# Patient Record
Sex: Female | Born: 1990 | Race: Black or African American | Hispanic: No | Marital: Single | Smoking: Current every day smoker
Health system: Southern US, Community
[De-identification: ages and names within clinical notes are randomized; demographics above are authoritative.]

## PROBLEM LIST (undated history)

## (undated) DIAGNOSIS — R51 Headache: Secondary | ICD-10-CM

---
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
## 2008-01-01 ENCOUNTER — Encounter (INDEPENDENT_AMBULATORY_CARE_PROVIDER_SITE_OTHER): Payer: Self-pay | Admitting: Family Medicine

## 2008-01-01 ENCOUNTER — Ambulatory Visit (HOSPITAL_COMMUNITY): Admission: RE | Admit: 2008-01-01 | Discharge: 2008-01-01 | Payer: Self-pay | Admitting: Family Medicine

## 2008-01-25 ENCOUNTER — Encounter: Payer: Self-pay | Admitting: *Deleted

## 2008-01-31 ENCOUNTER — Ambulatory Visit: Payer: Self-pay | Admitting: Obstetrics and Gynecology

## 2008-01-31 ENCOUNTER — Inpatient Hospital Stay (HOSPITAL_COMMUNITY): Admission: AD | Admit: 2008-01-31 | Discharge: 2008-01-31 | Payer: Self-pay | Admitting: Obstetrics & Gynecology

## 2008-02-06 ENCOUNTER — Inpatient Hospital Stay (HOSPITAL_COMMUNITY): Admission: AD | Admit: 2008-02-06 | Discharge: 2008-02-06 | Payer: Self-pay | Admitting: Obstetrics & Gynecology

## 2008-02-06 ENCOUNTER — Ambulatory Visit: Payer: Self-pay | Admitting: Advanced Practice Midwife

## 2008-02-15 ENCOUNTER — Encounter (INDEPENDENT_AMBULATORY_CARE_PROVIDER_SITE_OTHER): Payer: Self-pay | Admitting: *Deleted

## 2008-02-19 ENCOUNTER — Ambulatory Visit: Payer: Self-pay | Admitting: Family Medicine

## 2008-02-19 ENCOUNTER — Encounter (INDEPENDENT_AMBULATORY_CARE_PROVIDER_SITE_OTHER): Payer: Self-pay | Admitting: Family Medicine

## 2008-02-19 DIAGNOSIS — A630 Anogenital (venereal) warts: Secondary | ICD-10-CM

## 2008-02-19 LAB — CONVERTED CEMR LAB
HCT: 35 % — ABNORMAL LOW (ref 36.0–46.0)
MCV: 96.7 fL (ref 78.0–100.0)
Platelets: 273 10*3/uL (ref 150–400)
RDW: 12.9 % (ref 11.5–15.5)

## 2008-03-14 ENCOUNTER — Ambulatory Visit: Payer: Self-pay | Admitting: Family Medicine

## 2008-03-26 ENCOUNTER — Ambulatory Visit: Payer: Self-pay | Admitting: Family Medicine

## 2008-03-29 HISTORY — PX: HERNIA REPAIR: SHX51

## 2008-04-10 ENCOUNTER — Ambulatory Visit: Payer: Self-pay | Admitting: Family Medicine

## 2008-04-12 ENCOUNTER — Ambulatory Visit: Payer: Self-pay | Admitting: Obstetrics and Gynecology

## 2008-04-12 ENCOUNTER — Inpatient Hospital Stay (HOSPITAL_COMMUNITY): Admission: AD | Admit: 2008-04-12 | Discharge: 2008-04-12 | Payer: Self-pay | Admitting: Obstetrics & Gynecology

## 2008-04-12 ENCOUNTER — Telehealth (INDEPENDENT_AMBULATORY_CARE_PROVIDER_SITE_OTHER): Payer: Self-pay | Admitting: Family Medicine

## 2008-04-23 ENCOUNTER — Ambulatory Visit: Payer: Self-pay | Admitting: Family Medicine

## 2008-05-03 ENCOUNTER — Ambulatory Visit: Payer: Self-pay | Admitting: Family Medicine

## 2008-05-03 ENCOUNTER — Encounter (INDEPENDENT_AMBULATORY_CARE_PROVIDER_SITE_OTHER): Payer: Self-pay | Admitting: Family Medicine

## 2008-05-03 ENCOUNTER — Encounter: Payer: Self-pay | Admitting: *Deleted

## 2008-05-06 ENCOUNTER — Encounter: Payer: Self-pay | Admitting: *Deleted

## 2008-05-07 ENCOUNTER — Encounter: Payer: Self-pay | Admitting: *Deleted

## 2008-05-08 ENCOUNTER — Ambulatory Visit (HOSPITAL_COMMUNITY): Admission: RE | Admit: 2008-05-08 | Discharge: 2008-05-08 | Payer: Self-pay | Admitting: Family Medicine

## 2008-05-08 ENCOUNTER — Inpatient Hospital Stay (HOSPITAL_COMMUNITY): Admission: AD | Admit: 2008-05-08 | Discharge: 2008-05-09 | Payer: Self-pay | Admitting: Obstetrics & Gynecology

## 2008-05-09 ENCOUNTER — Ambulatory Visit: Payer: Self-pay | Admitting: Family Medicine

## 2008-05-14 ENCOUNTER — Inpatient Hospital Stay (HOSPITAL_COMMUNITY): Admission: AD | Admit: 2008-05-14 | Discharge: 2008-05-14 | Payer: Self-pay | Admitting: Family Medicine

## 2008-05-14 ENCOUNTER — Telehealth (INDEPENDENT_AMBULATORY_CARE_PROVIDER_SITE_OTHER): Payer: Self-pay | Admitting: Family Medicine

## 2008-05-16 ENCOUNTER — Ambulatory Visit: Payer: Self-pay | Admitting: Family Medicine

## 2008-05-17 ENCOUNTER — Ambulatory Visit: Payer: Self-pay | Admitting: Family Medicine

## 2008-05-21 ENCOUNTER — Inpatient Hospital Stay (HOSPITAL_COMMUNITY): Admission: AD | Admit: 2008-05-21 | Discharge: 2008-05-21 | Payer: Self-pay | Admitting: Obstetrics & Gynecology

## 2008-05-22 ENCOUNTER — Ambulatory Visit: Payer: Self-pay | Admitting: Family Medicine

## 2008-05-27 ENCOUNTER — Ambulatory Visit: Payer: Self-pay | Admitting: Obstetrics and Gynecology

## 2008-05-27 ENCOUNTER — Inpatient Hospital Stay (HOSPITAL_COMMUNITY): Admission: AD | Admit: 2008-05-27 | Discharge: 2008-05-27 | Payer: Self-pay | Admitting: Obstetrics & Gynecology

## 2008-05-27 ENCOUNTER — Inpatient Hospital Stay (HOSPITAL_COMMUNITY): Admission: AD | Admit: 2008-05-27 | Discharge: 2008-05-29 | Payer: Self-pay | Admitting: Obstetrics & Gynecology

## 2008-05-27 ENCOUNTER — Encounter (INDEPENDENT_AMBULATORY_CARE_PROVIDER_SITE_OTHER): Payer: Self-pay | Admitting: Family Medicine

## 2008-05-27 ENCOUNTER — Ambulatory Visit: Payer: Self-pay | Admitting: Family Medicine

## 2008-05-30 ENCOUNTER — Telehealth (INDEPENDENT_AMBULATORY_CARE_PROVIDER_SITE_OTHER): Payer: Self-pay | Admitting: Family Medicine

## 2008-07-16 ENCOUNTER — Emergency Department (HOSPITAL_COMMUNITY): Admission: EM | Admit: 2008-07-16 | Discharge: 2008-07-16 | Payer: Self-pay | Admitting: Emergency Medicine

## 2008-07-17 ENCOUNTER — Telehealth: Payer: Self-pay | Admitting: *Deleted

## 2008-07-19 ENCOUNTER — Ambulatory Visit: Payer: Self-pay | Admitting: Family Medicine

## 2008-07-19 DIAGNOSIS — K429 Umbilical hernia without obstruction or gangrene: Secondary | ICD-10-CM | POA: Insufficient documentation

## 2008-10-24 ENCOUNTER — Encounter: Payer: Self-pay | Admitting: Family Medicine

## 2008-11-26 ENCOUNTER — Encounter: Payer: Self-pay | Admitting: Family Medicine

## 2008-11-29 ENCOUNTER — Encounter: Payer: Self-pay | Admitting: Family Medicine

## 2009-03-25 ENCOUNTER — Encounter: Payer: Self-pay | Admitting: Family Medicine

## 2009-03-25 ENCOUNTER — Ambulatory Visit: Payer: Self-pay | Admitting: Family Medicine

## 2009-03-25 DIAGNOSIS — R110 Nausea: Secondary | ICD-10-CM

## 2009-03-25 DIAGNOSIS — N912 Amenorrhea, unspecified: Secondary | ICD-10-CM | POA: Insufficient documentation

## 2009-03-25 LAB — CONVERTED CEMR LAB
Antibody Screen: NEGATIVE
Basophils Relative: 0 % (ref 0–1)
Eosinophils Absolute: 0.1 10*3/uL (ref 0.0–0.7)
Eosinophils Relative: 1 % (ref 0–5)
HCT: 37.4 % (ref 36.0–46.0)
Hemoglobin: 12.8 g/dL (ref 12.0–15.0)
Hepatitis B Surface Ag: NEGATIVE
MCHC: 34.2 g/dL (ref 30.0–36.0)
MCV: 92.6 fL (ref 78.0–100.0)
Monocytes Absolute: 0.5 10*3/uL (ref 0.1–1.0)
Monocytes Relative: 6 % (ref 3–12)
RBC: 4.04 M/uL (ref 3.87–5.11)
RDW: 12.7 % (ref 11.5–15.5)
Rh Type: POSITIVE
Sickle Cell Screen: NEGATIVE

## 2009-04-14 ENCOUNTER — Ambulatory Visit: Payer: Self-pay | Admitting: Family Medicine

## 2009-04-17 ENCOUNTER — Encounter: Payer: Self-pay | Admitting: Family Medicine

## 2009-04-17 ENCOUNTER — Encounter (INDEPENDENT_AMBULATORY_CARE_PROVIDER_SITE_OTHER): Payer: Self-pay | Admitting: *Deleted

## 2009-04-17 ENCOUNTER — Ambulatory Visit (HOSPITAL_COMMUNITY): Admission: RE | Admit: 2009-04-17 | Discharge: 2009-04-17 | Payer: Self-pay | Admitting: Family Medicine

## 2009-05-12 ENCOUNTER — Encounter: Payer: Self-pay | Admitting: Family Medicine

## 2009-05-12 ENCOUNTER — Other Ambulatory Visit: Admission: RE | Admit: 2009-05-12 | Discharge: 2009-05-12 | Payer: Self-pay | Admitting: Family Medicine

## 2009-05-12 ENCOUNTER — Ambulatory Visit: Payer: Self-pay | Admitting: Family Medicine

## 2009-05-12 LAB — CONVERTED CEMR LAB
Chlamydia, DNA Probe: NEGATIVE
GC Probe Amp, Genital: NEGATIVE
Whiff Test: NEGATIVE

## 2009-05-14 ENCOUNTER — Encounter: Payer: Self-pay | Admitting: Family Medicine

## 2009-05-19 ENCOUNTER — Encounter: Payer: Self-pay | Admitting: Family Medicine

## 2009-05-21 ENCOUNTER — Encounter: Payer: Self-pay | Admitting: *Deleted

## 2009-05-22 ENCOUNTER — Telehealth: Payer: Self-pay | Admitting: *Deleted

## 2009-05-23 ENCOUNTER — Encounter: Payer: Self-pay | Admitting: *Deleted

## 2009-05-28 ENCOUNTER — Encounter: Payer: Self-pay | Admitting: *Deleted

## 2009-06-04 ENCOUNTER — Ambulatory Visit (HOSPITAL_COMMUNITY): Admission: RE | Admit: 2009-06-04 | Discharge: 2009-06-04 | Payer: Self-pay | Admitting: Family Medicine

## 2009-06-04 ENCOUNTER — Encounter: Payer: Self-pay | Admitting: Family Medicine

## 2009-06-11 ENCOUNTER — Encounter: Payer: Self-pay | Admitting: Family Medicine

## 2009-07-08 ENCOUNTER — Ambulatory Visit: Payer: Self-pay | Admitting: Family Medicine

## 2009-08-21 ENCOUNTER — Ambulatory Visit: Payer: Self-pay | Admitting: Family Medicine

## 2009-09-03 ENCOUNTER — Encounter: Payer: Self-pay | Admitting: Family Medicine

## 2009-09-03 ENCOUNTER — Ambulatory Visit: Payer: Self-pay | Admitting: Family Medicine

## 2009-09-03 LAB — CONVERTED CEMR LAB
HCT: 34.2 % — ABNORMAL LOW
Hemoglobin: 11.9 g/dL — ABNORMAL LOW
MCHC: 34.8 g/dL
MCV: 95.8 fL
Platelets: 225 K/uL
RBC: 3.57 M/uL — ABNORMAL LOW
RDW: 12.9 %
WBC: 10.2 10*3/microliter

## 2009-09-04 ENCOUNTER — Encounter: Payer: Self-pay | Admitting: Family Medicine

## 2009-09-04 ENCOUNTER — Ambulatory Visit (HOSPITAL_COMMUNITY): Admission: RE | Admit: 2009-09-04 | Discharge: 2009-09-04 | Payer: Self-pay | Admitting: Family Medicine

## 2009-09-17 ENCOUNTER — Ambulatory Visit: Payer: Self-pay | Admitting: Family Medicine

## 2009-09-25 ENCOUNTER — Encounter: Payer: Self-pay | Admitting: Family Medicine

## 2009-09-26 ENCOUNTER — Encounter: Payer: Self-pay | Admitting: Family Medicine

## 2009-09-26 ENCOUNTER — Ambulatory Visit: Payer: Self-pay | Admitting: Family Medicine

## 2009-09-26 DIAGNOSIS — B373 Candidiasis of vulva and vagina: Secondary | ICD-10-CM

## 2009-09-26 LAB — CONVERTED CEMR LAB
Chlamydia, DNA Probe: NEGATIVE
GC Probe Amp, Genital: NEGATIVE

## 2009-09-30 ENCOUNTER — Ambulatory Visit: Payer: Self-pay | Admitting: Family Medicine

## 2009-09-30 ENCOUNTER — Encounter: Payer: Self-pay | Admitting: Family Medicine

## 2009-10-09 ENCOUNTER — Ambulatory Visit: Payer: Self-pay | Admitting: Family Medicine

## 2009-10-16 ENCOUNTER — Inpatient Hospital Stay (HOSPITAL_COMMUNITY): Admission: AD | Admit: 2009-10-16 | Discharge: 2009-10-18 | Payer: Self-pay | Admitting: Obstetrics & Gynecology

## 2009-10-17 ENCOUNTER — Encounter: Payer: Self-pay | Admitting: Family Medicine

## 2009-11-26 ENCOUNTER — Ambulatory Visit: Payer: Self-pay | Admitting: Family Medicine

## 2009-12-22 ENCOUNTER — Emergency Department (HOSPITAL_COMMUNITY): Admission: EM | Admit: 2009-12-22 | Discharge: 2009-12-22 | Payer: Self-pay | Admitting: Emergency Medicine

## 2010-01-07 ENCOUNTER — Ambulatory Visit: Payer: Self-pay | Admitting: Family Medicine

## 2010-04-01 ENCOUNTER — Ambulatory Visit: Admit: 2010-04-01 | Payer: Self-pay

## 2010-04-19 ENCOUNTER — Encounter: Payer: Self-pay | Admitting: Family Medicine

## 2010-04-28 NOTE — Miscellaneous (Signed)
Summary: OB US approved  Clinical Lists Changes medsolutions approved ob ultrasound #[id number].Golden Circle RN  May 23, 2009 4:06 PM

## 2010-04-28 NOTE — Miscellaneous (Signed)
Summary: OB US approved  Clinical Lists Changes medsolutions approved the ob ultrasound #[id number].Golden Circle RN  May 28, 2009 10:00 AM

## 2010-04-28 NOTE — Assessment & Plan Note (Signed)
Summary: 32.0 wk O, G4P1021,1 GTT, labs.    Vital Signs:  Patient profile:   20 year old female Weight:      137.1 pounds Temp:     98.9 degrees F oral Pulse rate:   90 / minute Pulse rhythm:   regular BP sitting:   99 / 63  (left arm) Cuff size:   regular  Vitals Entered By: Loralee Pacas CMA (September 03, 2009 2:59 PM)  Primary Care Provider:  Jamie Brookes MD  CC:  32.0 wks OB.  History of Present Illness: 20 y/o [id number] with EDD of 10-29-09. She comes in today having gained weight but not gained fundal height. She is getting her 1 hr GTT and other labs done today since she could not stay for them last time.    As per Corrie Dandy Martin's note:  She reports she has had 2 prior SAB's, both < 10 weeks.  She has no idea who her prior OB was; she only knows [facility] so records regarding these miscarriages are not obtainable.  She has a family h/o blood clots (father and paternal grandmother both died from blood clots- father was in his 72's).  She had bloodwork on 09/13/07 that included lupus anticoagulant, anticardiolipin antibody, and beta-2 glycoprotein IgG and IgM that were all negative.   Habits & Providers  Alcohol-Tobacco-Diet     Cigarette Packs/Day: n/a  Current Medications (verified): 1)  Prenatal Vitamins 0.8 Mg Tabs (Prenatal Multivit-Min-Fe-Fa) .... Take 1 Daily  Allergies (verified): 1)  ! Codeine 2)  ! Ultram (Tramadol Hcl)  Social History: Not working, dropped out of high school.  Lives with mom and step dad, boyfriend lives with her.  Sexually active at age 47 w/high risk behavior. Active smoker (4 cigs per day), but quit when she got pregnant.  Denies alcohol and illicit drug use.  Mom is deaf.  Review of Systems        vitals reviewed and pertinent negatives and positives seen in HPI    Impression & Recommendations:  Problem # 1:  PREGNANT STATE, INCIDENTAL (ICD-V22.2) Assessment Deteriorated  Pt has a fundal height of 28 and she is 32  weeks. She has been measuring 28 cm for the last 2 visits.  Plan to get an [name] to measure growth. Will see her back in 2 weeks unless there is somethin concerning on [name]. Passed her 1 hr GTT today.   Orders: Glucose 1 hr-[facility] (30865) Prenatal U/S > 14 weeks - 78469 (Prenatal U/S) Medicaid OB visit - [facility] ([id number])  Complete Medication List: 1)  Prenatal Vitamins 0.8 Mg Tabs (Prenatal multivit-min-fe-fa) .... Take 1 daily   Flowsheet View for Follow-up Visit    Estimated weeks of       gestation:     32.0    Weight:     137.1    Blood pressure:   99 / 63    Hx headache?     No    Nausea/vomiting?   No    Edema?     0    Bleeding?     no    Leakage/discharge?   no    Fetal activity:       yes    Labor symptoms?   no    Fundal height:      28    FHR:       155    Fetal position:      vertex    Taking  Vitamins?   Y    Smoking PPD:   n/a    Next visit:     2 wk    Resident:     Clotilde Dieter    Preceptor:     McDiarmid

## 2010-04-28 NOTE — Miscellaneous (Signed)
Summary: Anatomy US order  Clinical Lists Changes  Orders: Added new Test order of Prenatal U/S > 14 weeks - [address] (Prenatal U/S) - Signed  Appended Document: Anatomy US order Pt aware of teh early [name] need and that AFP was borderline elevated. She has no questions at this time. Encouraged to call back if she has any questions.

## 2010-04-28 NOTE — Letter (Signed)
Summary: Generic Letter  [facility]  [address]   [place], [place] [address]   Phone: [phone]  Fax: [phone]    06/11/2009  Kimberly Bradley [address] [place], [place]  [address]  Dear [name], Your pregnancy test on 03-25-09 indicated that you are pregnant. You expected due date based on ultrasound is 10-29-09. If you have any further questions please contact our office.     Sincerely,   Jamie Brookes MD

## 2010-04-28 NOTE — Miscellaneous (Signed)
Summary: OB ultra sound  Clinical Lists Changes OB ultra sound was approved Auth #[id number] .Marland KitchenGladstone Pih  April 17, 2009 4:04 PM

## 2010-04-28 NOTE — Assessment & Plan Note (Signed)
Summary: 23.6 OB   Vital Signs:  Patient profile:   20 year old female Height:      67 inches Weight:      125.6 pounds BMI:     19.74 Temp:     98.1 degrees F oral Pulse rate:   101 / minute BP sitting:   101 / 66  (left arm) Cuff size:   regular  Vitals Entered By: Gladstone Pih (July 08, 2009 2:37 PM) CC: 23.6 OB Is Patient Diabetic? No Pain Assessment Patient in pain? no        Primary Care Provider:  Jamie Brookes MD  CC:  23.6 OB.  History of Present Illness: Pt is doing well. She comes alone. Says the father of the baby is supportive. He is happy about the pregnancy. He is the father of her other child. (Destiny)  Habits & Providers  Alcohol-Tobacco-Diet     Tobacco Status: never     Cigarette Packs/Day: n/a  Allergies: 1)  ! Codeine 2)  ! Ultram (Tramadol Hcl)   Impression & Recommendations:  Problem # 1:  PREGNANT STATE, INCIDENTAL (ICD-V22.2) Assessment Unchanged PT will be due for 1 hr GTT at next visit. She is aware. She is doing well. She has no concerns. Home life is going well.  Orders: Medicaid OB visit - [facility] ([id number])  Complete Medication List: 1)  Prenatal Vitamins 0.8 Mg Tabs (Prenatal multivit-min-fe-fa) .... Take 1 daily  Patient Instructions: 1)  You will need a 1 hour glucose test at your next visit.  2)  I will see you in  1 month.     Flowsheet View for Follow-up Visit    Estimated weeks of       gestation:     23.6    Weight:     125.6    Blood pressure:   101 / 66    Headache:     No    Nausea/vomiting:   No    Edema:     0    Vaginal bleeding:   no    Vaginal discharge:   no    Fundal height:      20    FHR:       140    Fetal activity:     yes    Labor symptoms:   no    Fetal position:     N/A    Taking prenatal vits?   Y    Smoking:     n/a    Next visit:     4 wk    Resident:     Clotilde Dieter    Preceptor:     Deirdre Priest

## 2010-04-28 NOTE — Letter (Signed)
Summary: Results Follow-up Letter  [facility]  [address]   [place], [place] [address]   Phone: [phone]  Fax: [phone]    05/14/2009  [address] [place], [place]  [address]  Dear Ms. Bais,   The following are the results of your recent test(s):  Test     Result     Pap Smear    Normal_______  Not Normal__X___       Comments: Your pap smear result reads as follows: ATYPICAL SQUAMOUS CELLS OF UNDETERMINED SIGNIFICANCE  This means that we will need to do another pap smear 6 weeks after you deliver the baby. This does not cause any risk to the baby. It just needs to be reevaluated after the baby is born. There are some cells that look different so we will look at them again at your 6 week post-partum visit. If you have any questions please call my office.    Sincerely,  Jamie Brookes MD [facility]

## 2010-04-28 NOTE — Assessment & Plan Note (Signed)
Summary: 15.5 wk ob,tcb   Vital Signs:  Patient profile:   20 year old female Weight:      111.5 pounds Temp:     98 degrees F oral Pulse rate:   85 / minute Pulse rhythm:   regular BP sitting:   108 / 71  (right arm) Cuff size:   regular  Vitals Entered By: Loralee Pacas CMA (May 12, 2009 11:25 AM)   Primary Care Provider:  Jamie Brookes MD   History of Present Illness: 20 y/o [phone] with EDD of 10-29-09. She comes in today with Isiah the father of her baby. She did not get a PAP/GC/Chlam at her last appt so we are doing them today.   Habits & Providers  Alcohol-Tobacco-Diet     Cigarette Packs/Day: n/a  Current Medications (verified): 1)  Prenatal Vitamins 0.8 Mg Tabs (Prenatal Multivit-Min-Fe-Fa) .... Take 1 Daily  Allergies (verified): 1)  ! Codeine 2)  ! Ultram (Tramadol Hcl)  Review of Systems        vitals reviewed and pertinent negatives and positives seen in HPI   Physical Exam  Abdomen:  stria from prior pregnancy, uterus measures 9 cm. Pt is thin  Genitalia:  Normal introitus for age, no external lesions, no vaginal discharge, mucosa pink and moist, no vaginal or cervical lesions, no vaginal atrophy, no friaility or hemorrhage, normal uterus size and position, no adnexal masses or tenderness   Impression & Recommendations:  Problem # 1:  PREGNANT STATE, INCIDENTAL (ICD-V22.2) Assessment Unchanged Pt is doing well, no concerns today. She is getting the QUAD screen done today. She missed the integrative screen due to her dates change on [name]. We are doing Pap and GC/Chlam testing today since she missed it the first visit. Pt is having nausea. Suggested Vit B6 and ginger. Suggested using Tylenol for headaches. Pt has yeast found on wet prep but is assymptomatic. Will treat if pt becomes symptomatic.   Orders: AFP/Quad Scr-[facility] ([id number]) GC/Chlamydia-[facility] (87591/87491) Pap Smear-[facility] ([id number]) Wet Prep- [facility] ([address]) Medicaid OB visit - [facility]  ([id number])  Complete Medication List: 1)  Prenatal Vitamins 0.8 Mg Tabs (Prenatal multivit-min-fe-fa) .... Take 1 daily  Patient Instructions: 1)  You are 15 weeks and 5 days today. Your new Due date is 10-29-09. 2)  You can go to the pharmacy and get Vitamin B 6 to help with the nausea. Also ginger candies and other forms of ginger help with nausea.  3)  I will let you know if there is any abnormality on your lab work.  4)  I will see you in 1 month.      Flowsheet View for Follow-up Visit    Estimated weeks of       gestation:     15.5    Weight:     111.5    Blood pressure:   108 / 71    Hx headache?     few    Nausea/vomiting?   vom1/d    Edema?     0    Bleeding?     no    Leakage/discharge?   no    Fetal activity:       N/A    Labor symptoms?   no    Fundal height:      9    FHR:       155    Fetal position:      N/A    Taking Vitamins?   Y    Smoking  PPD:   n/a    Comment:     EDD changed based on [name]    Next visit:     4 wk    Resident:     Clotilde Dieter    Preceptor:     Mauricio Po  Laboratory Results  Date/Time Received: May 12, 2009 12:17 PM  Date/Time Reported: May 12, 2009 12:26 PM   Wet [place] Source: vag WBC/hpf: >20 Bacteria/hpf: 3+  Rods Clue cells/hpf: none  Negative whiff Yeast/hpf: many Trichomonas/hpf: none Comments: ...............test performed by......Marland KitchenBonnie A. [place], MLS (ASCP)cm

## 2010-04-28 NOTE — Miscellaneous (Signed)
Summary: cramping  Clinical Lists Changes 35 wk pregant. hard belly x 2 days. cramping comes & goes. baby is moving well. this is her 2nd child. she does not think she has to go to [facility] but wants to be checked. appt made with pcp at 1:30 tomorrow. told her if pain increases, baby does not move as much or gush of fluid or blood from vagina, [facility]. she agreed.Golden Circle RN  September 25, 2009 3:41 PM   Will be happy to see her. Jamie Brookes MD  September 25, 2009 10:56 PM

## 2010-04-28 NOTE — Assessment & Plan Note (Signed)
Summary: 35.6 ob   Vital Signs:  Patient profile:   20 year old female Height:      67 inches Weight:      146.1 pounds BMI:     22.97 Temp:     98.5 degrees F oral Pulse rate:   94 / minute BP sitting:   122 / 70  (left arm) Cuff size:   regular  Vitals Entered By: Gladstone Pih (September 30, 2009 2:08 PM) CC: 35.6 wk OB, GBS test Is Patient Diabetic? No Pain Assessment Patient in pain? no        Primary Care Provider:  Jamie Brookes MD  CC:  35.6 wk OB and GBS test.  History of Present Illness: G4P1021 at 35.6 wks comes in today for her regular OB check up. Pt is suppose to be seen at the next [facility] OB clinic. She has come to see me instead so I will do the GBS testing and since she just had GC/Chlam last week I will not repeat these.   Habits & Providers  Alcohol-Tobacco-Diet     Tobacco Status: never     Cigarette Packs/Day: n/a  Current Medications (verified): 1)  Prenatal Vitamins 0.8 Mg Tabs (Prenatal Multivit-Min-Fe-Fa) .... Take 1 Daily 2)  Fluconazole 150 Mg Tabs (Fluconazole) .... Take 1 Pill Now, Take Another Pill in 4 Days If You Are Not Feeling Better With Less Discharge.  Allergies (verified): 1)  ! Codeine 2)  ! Ultram (Tramadol Hcl)  Social History: Not working, dropped out of high school.  Lives with mom and step dad, boyfriend lives with her, he is not working either.  Sexually active at age 51 w/high risk behavior. Active smoker (4 cigs per day), but quit when she got pregnant.  Denies alcohol and illicit drug use.  Mom is deaf., sister is partially deafSmoking Status:  never  Review of Systems        vitals reviewed and pertinent negatives and positives seen in HPI    Impression & Recommendations:  Problem # 1:  PREGNANT STATE, INCIDENTAL (ICD-V22.2) Assessment Unchanged Pt needs to be seen at [facility] OB clinic at next appointment. She is 35.6 today. She is doing well except she still is feeling some "contractions" and has not gotten her medicine.  She was found to have a yeast infection at the last visit and has still not gotten her meds. Encouraged to go get her meds today. GBS done today  Orders: Grp B Probe-[facility] ([id number]) Medicaid OB visit - [facility] ([id number])  Complete Medication List: 1)  Prenatal Vitamins 0.8 Mg Tabs (Prenatal multivit-min-fe-fa) .... Take 1 daily 2)  Fluconazole 150 Mg Tabs (Fluconazole) .... Take 1 pill now, take another pill in 4 days if you are not feeling better with less discharge.  Patient Instructions: 1)  It was good to see you today.  2)  You need to be seen at the next [facility] OB clinic day. Please make this appointment before you leave today.  3)  Pick up your prescription today and start the medicine today. You will continue to have uterine irritability until you treat this infection.  4)  If you have any bleeding, gush of fluid, strong contractions that are coming every 4 minutes apart or any other major concern go to [facility]. 5)  If you are being admitted tot he hospital for labor have the nurse page met at [phone].  6)  You need to come every week from now until the baby is  born.     Marketing executive for Follow-up Visit    Estimated weeks of       gestation:     35.6    Weight:     146.1    Blood pressure:   122 / 70    Headache:     No    Nausea/vomiting:   No    Edema:     0    Vaginal bleeding:   no    Vaginal discharge:   d/c    Fundal height:      33    FHR:       143    Fetal activity:     yes    Labor symptoms:   few ctx    Fetal position:     vertex    Taking prenatal vits?   Y    Smoking:     n/a    Next visit:     1 wk    Resident:     Braian Tijerina    Preceptor:     Deirdre Priest

## 2010-04-28 NOTE — Assessment & Plan Note (Signed)
Summary: NOB ,  [id number]   Vital Signs:  Patient profile:   20 year old female LMP:     01/08/2009 Weight:      112.6 pounds Temp:     98.1 degrees F oral Pulse rate:   60 / minute Pulse rhythm:   regular BP sitting:   104 / 66  (left arm) Cuff size:   regular  Vitals Entered By: Loralee Pacas CMA (April 14, 2009 4:51 PM) CC: New ob LMP (date): 01/08/2009 EDC by LMP==> 10/15/2009 LMP - Character: light LMP - Reliable? approximate (month known) Menarche (age onset years): 11   Menses interval (days): 30 Menstrual flow (days): 4-7 On BCP's at conception: no Date of + home preg. test: 03/24/2009 Enter LMP: 01/08/2009 Last PAP Result LGSIL   Primary Care Provider:  Jamie Brookes MD  CC:  New ob.  History of Present Illness: New OB: G4P1020 comes in today for a few OB visit. She has not had a period since around Oct 13th (she does not remember when exactly). She started having symptoms of pregnancy in Dec. She took a pregancy test at the end of Dec. She is not taking prenatal vitamins because she has not gotten her Medicaid card yet.   Habits & Providers  Alcohol-Tobacco-Diet     Cigarette Packs/Day: n/a  Allergies: 1)  ! Codeine 2)  ! Ultram (Tramadol Hcl)  Social History: Education:  highschool  Hepatitis Risk:  no   Impression & Recommendations:  Problem # 1:  PREGNANT STATE, INCIDENTAL (ICD-V22.2) Assessment New Pt is very unsure of her dates. Plan to get [name]. Since it is very late today we are going to postpone her Pap smear and GC/Chlam until the next visit in 2-3 weeks.  We will also discuss the integrated screen vs QUAD screen at that time.   Orders: Prenatal U/S < 14 weeks - [address]  (Prenatal U/S) Other OB visit- [facility] ([facility])  Complete Medication List: 1)  Darvocet-n 50 50-325 Mg Tabs (Propoxyphene n-apap) .Marland Kitchen.. 1-2 tablets by mouth every 6 hrs as needed for stomach pain 2)  Prenatal Vitamins 0.8 Mg Tabs (Prenatal multivit-min-fe-fa) .... Take 1  daily  Patient Instructions: 1)  We will set up an ultrasound for you and either we will call you or the ultrasound place will call you with an appoitnment.  2)  Please make an appointment for 2-3 weeks from today to have your pap smear and GC/Chlamydia tested.  3)  Take your prenatal vitamins as soon as possible.  4)  congratulations   OB Initial Intake Information    Positive HCG by: self    Race: White    Marital status: Single    Occupation: homemaker    Education (last grade completed): highschool     Number of children at home: 1    [facility]ospital [facility] delivery: [facility]    Newborn's physician: Joice Lofts Sanskriti Greenlaw  FOB Information    Husband/Father of baby: Roslyn Smiling    FOB occupation Unemployed    Phone: [phone]    FOB Comments: Involved with pregnancy, lives with Ms. Kercheval.  Menstrual History    LMP (date): 01/08/2009    EDC by LMP: 10/15/2009    LMP - Character: light    LMP - Reliable? : approximate (month known)    Menarche: 11 years    Menses interval: 30 days    Menstrual flow 4-7 days    On BCP's at conception: no    Date of positive (+)  home preg. test: 03/24/2009    Pre Pregnancy Weight: 112 lbs.    Symptoms since LMP: amenorrhea, nausea, vomiting, fatigue, irritability, tender breasts, urinary frequency  Prenatal Visit EDC Confirmation:    LMP reliable? approximate (month known)    Last menses onset (LMP) date: 01/08/2009    EDC by LMP: 10/15/2009   Past Pregnancy History    Gravida:     4    Term Births:     1    Para:       1  Pregnancy # 3    Delivery date:     05/28/2008    Weeks Gestation:   40    Preterm labor:     no    Delivery type:     NSVD    Hours of labor:     12    Anesthesia type:     epidural    [facility]    Infant Sex:     Female    Birth weight:     8 lbs 5 oz    Name:     Egbert Garibaldi  Pregnancy # 4    Comments:     current   Genetic History     Child with other       birth  defect:             comments: Dad's brother had child with 6th finger on each hand.   Infection Risk History    High Risk Hepatitis B: no    Immunized against Hepatitis B: no    Exposure to TB: yes    Patient with history of Genital Herpes: no    Sexual partner with history of Genital Herpes: no    History of STD (GC, Chlamydia, Syphilis, HPV): no    Rash, Viral, or Febrile Illness since LMP: no    Exposure to Cat Litter: no    Chicken Pox Immune Status: Hx of Disease: Immune    History of Parvovirus (Fifth Disease): no    Occupational Exposure to Children: none  Environmental Exposures    Xray Exposure since LMP: no    Chemical or other exposure: no    Medication, drug, or alcohol use since LMP: no   Flowsheet View for Follow-up Visit    Estimated weeks of       gestation:     13.5    Weight:     112.6    Blood pressure:   104 / 66    Headache:     No    Nausea/vomiting:   vom1/d    Edema:     0    Vaginal bleeding:   no    Vaginal discharge:   no    Fetal activity:     N/A    Labor symptoms:   no    Fetal position:     N/A    Taking prenatal vits?   Y    Smoking:     n/a    Next visit:     4 wk  Appended Document: NOB/DSL    Phone Note Outgoing Call   Call placed by: Loralee Pacas CMA,  April 15, 2009 4:47 PM Summary of Call: called pt several times to inform her of the [name] appt that has been set up and the pt does not have VM set up on her phone.  will try again tomorrow  Follow-up for Phone Call  called pt to inform her of her [name] appt and could not leave a VM Follow-up by: Loralee Pacas CMA,  April 16, 2009 1:44 PM      Appended Document: NOB/DSL Pt due for flu shot.  Rubella non-immune.  PE deferred due to time constraints.  Will complete next visit.

## 2010-04-28 NOTE — Assessment & Plan Note (Signed)
Summary: 35.2 wk ob check feeling contractions   Vital Signs:  Patient profile:   20 year old female Weight:      144.6 pounds Temp:     98.6 degrees F oral Pulse rate:   73 / minute Pulse rhythm:   regular BP sitting:   107 / 70  (left arm) Cuff size:   regular  Vitals Entered By: Loralee Pacas CMA (September 26, 2009 1:39 PM) CC: 4 days of contractions Comments cramping and pain in bottom of stomach since tuesday, some d/c   Primary Care Provider:  Jamie Brookes MD  CC:  4 days of contractions.  History of Present Illness: PT comes in at 35.2 weeks and has been having ''contractions" that are occuring "all the time" and only let up for about 10 minutes at the most. She has been feeling these for 4 days now. No gush of fluid, no blood, but increased discharge.    Habits & Providers  Alcohol-Tobacco-Diet     Cigarette Packs/Day: n/a  Current Medications (verified): 1)  Prenatal Vitamins 0.8 Mg Tabs (Prenatal Multivit-Min-Fe-Fa) .... Take 1 Daily 2)  Fluconazole 150 Mg Tabs (Fluconazole) .... Take 1 Pill Now, Take Another Pill in 4 Days If You Are Not Feeling Better With Less Discharge.  Allergies: 1)  ! Codeine 2)  ! Ultram (Tramadol Hcl)  Review of Systems        vitals reviewed and pertinent negatives and positives seen in HPI   Physical Exam  General:  Well-developed,well-nourished,in no acute distress; alert,appropriate and cooperative throughout examination Abdomen:  no contractions felt while I examined belly.  Genitalia:  copious amounts of curdish white discharge in vaginal vault. Nitrozine neg upon testing.  Psych:  Cognition and judgment appear intact. Alert and cooperative with normal attention span and concentration. No apparent delusions, illusions, hallucinations   Impression & Recommendations:  Problem # 1:  CANDIDIASIS OF VULVA AND VAGINA (ICD-112.1) Assessment New pt found to have a copious amount of discharge and found to be yeast positive. Will  treat with Diflucan. She will be seen again next week. She  is likely having uterine irritability due to this infection. Pt given [facility]. Go for gush of fluids, if her contractions don't calm down the the medicine, if she has any bleeding, etc.... Pt agrees.   Her updated medication list for this problem includes:    Fluconazole 150 Mg Tabs (Fluconazole) .Marland Kitchen... Take 1 pill now, take another pill in 4 days if you are not feeling better with less discharge.  Orders: [facility]- Est Level  3 (44010)  Problem # 2:  PREGNANT STATE, INCIDENTAL (ICD-V22.2) Assessment: Unchanged Pt is 35.2 today. She is to be seen in [facility] next week. She will need GBS testing at next appointment. She was fern neg and nitrozine neg.   Orders: GC/Chlamydia-[facility] ([id number]) Miscellaneous Lab Charge-[facility] [phone]) Wet Prep- [facility] (66440) [facility]- Est Level  3 (34742)  Complete Medication List: 1)  Prenatal Vitamins 0.8 Mg Tabs (Prenatal multivit-min-fe-fa) .... Take 1 daily 2)  Fluconazole 150 Mg Tabs (Fluconazole) .... Take 1 pill now, take another pill in 4 days if you are not feeling better with less discharge.  Patient Instructions: 1)  discussed that she has an appointment on Tues July 5th ith me.  2)  I ill see her then. 3)  If she has any labor symptoms (gush of fluid, blood, worse cramping) [facility].  4)  told verbally to patient.  Prescriptions: FLUCONAZOLE 150 MG TABS (FLUCONAZOLE) take 1 pill now, take another pill in 4 days if you are not feeling better with less discharge.  #2 x 0   Entered and Authorized by:   Jamie Brookes MD   Signed by:   Jamie Brookes MD on 09/26/2009   Method used:   Electronically to        [facility]  [address] [phone]* (retail)       [address]       [place], [place]  [address]       Ph: [phone] or [phone]       Fax: [phone]   RxID:   [id number]     Flowsheet  View for Follow-up Visit    Weight:     144.6    Blood pressure:   107 / 70    Nausea/vomiting:   No    Edema:     0    Vaginal bleeding:   no    Vaginal discharge:   d/c    Fundal height:      32    FHR:       140    Fetal activity:     yes    Labor symptoms:   few ctx    Taking prenatal vits?   Y    Smoking:     n/a    Laboratory Results  Date/Time Received: September 26, 2009 2:14 PM  Date/Time Reported: September 26, 2009 2:32 PM   [employer] Source: vag WBC/hpf: LOADED Bacteria/hpf: 3+  Rods Clue cells/hpf: none  Negative whiff Yeast/hpf: LOADED Trichomonas/hpf: none  Other Tests  [place]: absent Comments: ...............test performed by......Marland KitchenBonnie A. [place], MLS (ASCP)cm

## 2010-04-28 NOTE — Progress Notes (Signed)
Summary: [name] appt  Phone Note Call from Patient Call back at Home Phone [phone]   Caller: Patient Summary of Call: pt wants to know when her [name] appt is. Initial call taken by: De Nurse,  May 22, 2009 4:17 PM  Follow-up for Phone Call        spoke with pt. she has appt with MFM 06/04/09@2pm  Follow-up by: Loralee Pacas CMA,  May 22, 2009 4:22 PM     Appended Document: [name] appt    Clinical Lists Changes  Orders: Added new Test order of Radiology other (Radiology Other) - Signed

## 2010-04-28 NOTE — Assessment & Plan Note (Signed)
Summary: 34.9 ob, G4P1021, EDD 10-29-09, needs GBS at next visit   Vital Signs:  Patient profile:   20 year old female Weight:      141 pounds Temp:     98.6 degrees F oral Pulse rate:   86 / minute Pulse rhythm:   regular BP sitting:   114 / 73  (right arm) Cuff size:   regular  Vitals Entered By: Loralee Pacas CMA (September 17, 2009 9:02 AM)  Primary Care Provider:  Jamie Brookes MD  CC:  34.0 wks.  History of Present Illness: 20 y/o E4V4098 with EDD of 10-29-09. She comes in today having gained weight andt gained fundal height.  Her recent ultrasound was normal. She has a normal 1 hr GTT and labs. She will need GBS testing at next visit.  Plans to get epidural, wants Depo and bottle fed last time. Discussed Breastfeeding with patient. She may try to work with the lactation nurses this time, but says the baby wouldn't latch last time so she bottle fed.    As per Corrie Dandy Martin's note:  She reports she has had 2 prior SAB's, both < 10 weeks.  She has no idea who her prior OB was; she only knows he was somewhere in [facility] so records regarding these miscarriages are not obtainable.  She has a family h/o blood clots (father and paternal grandmother both died from blood clots- father was in his 63's).  She had bloodwork on 09/13/07 that included lupus anticoagulant, anticardiolipin antibody, and beta-2 glycoprotein IgG and IgM that were all negative.  Habits & Providers  Alcohol-Tobacco-Diet     Cigarette Packs/Day: n/a  Current Medications (verified): 1)  Prenatal Vitamins 0.8 Mg Tabs (Prenatal Multivit-Min-Fe-Fa) .... Take 1 Daily  Allergies (verified): 1)  ! Codeine 2)  ! Ultram (Tramadol Hcl)  Review of Systems        vitals reviewed and pertinent negatives and positives seen in HPI    Impression & Recommendations:  Problem # 1:  PREGNANT STATE, INCIDENTAL (ICD-V22.2) Assessment Unchanged Pt has had a little nausea and vomiting. Says it is worse with certain foods. She is  only vomiting food once a day. Plans to use Depo, bottle feed, wants epidural. Will see OB clinic in 2 weeks. Needs GBS testing at that time.   Orders: Medicaid OB visit - [facility] ([id number])  Complete Medication List: 1)  Prenatal Vitamins 0.8 Mg Tabs (Prenatal multivit-min-fe-fa) .... Take 1 daily  Patient Instructions: 1)  Make an appointment with OB clinic in 2 weeks (you will not see me but will see another doctor who works in [employer] as a one time visit). 2)  I will see you in 3 weeks.      Flowsheet View for Follow-up Visit    Estimated weeks of       gestation:     34.0    Weight:     141    Blood pressure:   114 / 73    Headache:     No    Nausea/vomiting:   vom1/d    Edema:     0    Vaginal bleeding:   no    Vaginal discharge:   no    Fundal height:      31.5    FHR:       135    Fetal activity:     yes    Labor symptoms:   no    Fetal position:  vertex    Taking prenatal vits?   Y    Smoking:     n/a    Next visit:     2 wk    Resident:     Clotilde Dieter    Preceptor:     [place]

## 2010-04-28 NOTE — Miscellaneous (Signed)
Summary: ob US approved  Clinical Lists Changes #[id number] approval for ob US.Golden Circle RN  May 21, 2009 12:21 PM  Appended Document: ob US approved    Clinical Lists Changes  Orders: Added new Test order of Ultrasound (Ultrasound) - Signed

## 2010-04-28 NOTE — Assessment & Plan Note (Signed)
Summary: depo inj,tcb  Nurse Visit   Allergies: 1)  ! Codeine 2)  ! Ultram (Tramadol Hcl)  Medication Administration  Injection # 1:    Medication: Depo-Provera 150mg     Diagnosis: CONTRACEPTIVE MANAGEMENT (ICD-V25.09)    Route: IM    Site: R deltoid    Exp Date: 05/2012    Lot #: [id number]    Mfr: [email or web address]    Comments: next depo due Dec 28 thru  Apr 08, 2009    Patient tolerated injection without complications    Given by: Theresia Lo RN (January 07, 2010 5:13 PM)  Orders Added: 1)  Admin of Injection (IM/SQ) [04540] 2)  Depo-Provera 150mg  [J1055]   Medication Administration  Injection # 1:    Medication: Depo-Provera 150mg     Diagnosis: CONTRACEPTIVE MANAGEMENT (ICD-V25.09)    Route: IM    Site: R deltoid    Exp Date: 05/2012    Lot #: [id number]    Mfr: [email or web address]    Comments: next depo due Dec 28 thru  Apr 08, 2009    Patient tolerated injection without complications    Given by: Theresia Lo RN (January 07, 2010 5:13 PM)  Orders Added: 1)  Admin of Injection (IM/SQ) [47829] 2)  Depo-Provera 150mg  [J1055]   patient  received Depo Provera at [facility] after birth of baby on 10/18/2009.  Theresia Lo RN  January 07, 2010 5:16 PM .  Appended Document: depo inj,tcb    Clinical Lists Changes  Medications: Added new medication of * DEPO PROVERA injection every 3months for contraception.

## 2010-04-28 NOTE — Assessment & Plan Note (Signed)
Summary: OB 37 wks.  [employer] [place], [employer] forms completed   Vital Signs:  Patient profile:   20 year old female Height:      67 inches Weight:      146.4 pounds BMI:     23.01 Temp:     98.1 degrees F oral Pulse rate:   61 / minute BP sitting:   116 / 76  (left arm) Cuff size:   regular  Vitals Entered By: Gladstone Pih (October 09, 2009 10:06 AM) CC: OB Is Patient Diabetic? No Pain Assessment Patient in pain? no      LMP (date): 01/08/2009 EDC 10/29/2009   CC:  OB.  Habits & Providers  Alcohol-Tobacco-Diet     Tobacco Status: never     Cigarette Packs/Day: n/a  Allergies: 1)  ! Codeine 2)  ! Ultram (Tramadol Hcl)   Impression & Recommendations:  Problem # 1:  PREGNANT STATE, INCIDENTAL (ICD-V22.2)  Doing well with normal discomforts of pregnancy.   Plans to use Depo.   Bottle feed.  Dr. Clotilde Dieter for baby care. Reviewed kick counts and labor precautions.  F/u 1 week for routine visit with Dr. Clotilde Dieter. TUMS as needed heartburn. [employer] [place], [place] forms done today. PHQ-9 score 10, with postive answers for many things she feels are related to pregnancy (Tired and little energy, Trouble sleeping and Little Pleasure in doing things because it is such an effort at this stage in pregnancy). Chart review indicates pt reports exposrue to TB at NOB visit.  Today, she denies any h/o exposure to TB.    Orders: Medicaid OB visit - [facility] ([id number])  Complete Medication List: 1)  Prenatal Vitamins 0.8 Mg Tabs (Prenatal multivit-min-fe-fa) .... Take 1 daily 2)  Fluconazole 150 Mg Tabs (Fluconazole) .... Take 1 pill now, take another pill in 4 days if you are not feeling better with less discharge.  Patient Instructions: 1)  It was nice to meet you today. 2)  Please schedule an appt in 1 week with Dr. Clotilde Dieter. 3)  If you have contractions every 5 minutes, if your water breaks, if you have vaginal bleeding, or if the baby is not moving well, please go to [facility].  Prenatal Visit Concerns noted: Doing well.  No concerns.  Does have trouble sleeping due to urination and heartburn, but declines treatment for the heartburn.  Feels ready for baby to come.   EDC Confirmation:    New working [facility]: 10/29/2009 Ultrasound Dating Information:    First U/S on 04/17/2009   Gest age: 22 and1/7   EDC: 10/29/2009.   Flowsheet View for Follow-up Visit    Estimated weeks of       gestation:     37 1/7    Weight:     146.4    Blood pressure:   116 / 76    Nausea/vomiting:   No    Edema:     0    Vaginal bleeding:   no    Vaginal discharge:   no    Fundal height:      35    FHR:       150s    Fetal activity:     yes    Labor symptoms:   no    Fetal position:     vertex, confirmed by sono    Taking prenatal vits?   Y    Smoking:     n/a    Next visit:     1  wk    Preceptor:     [place] Prenatal Visit [facility] Confirmation:    New working [facility]: 10/29/2009

## 2010-04-28 NOTE — Letter (Signed)
Summary: Maternal Labor/Delivery Discharge  Maternal Labor/Delivery Discharge   Imported By: Knox Royalty 10/31/2009 09:56:36  _____________________________________________________________________  External Attachment:    Type:   Image     Comment:   External Document

## 2010-04-28 NOTE — Assessment & Plan Note (Signed)
Summary: 30.1 ob,  Kimberly Bradley   Vital Signs:  Patient profile:   20 year old female Height:      67 inches Weight:      134.5 pounds Pulse rate:   81 / minute BP sitting:   103 / 67  (left arm) Cuff size:   regular  Vitals Entered By: Gladstone Pih (Aug 21, 2009 3:32 PM) CC: 30.1 ob Is Patient Diabetic? No Pain Assessment Patient in pain? no        Primary Care Provider:  Jamie Brookes MD  CC:  30.1 ob.  History of Present Illness: 20 y/o E4V4098 with EDD of 10-29-09. She comes in today with Isiah the father of her baby. She can not stay to get labs done because her ride has to go but will come back early next week to get her labs done.    As per Corrie Dandy Martin's note:  She reports she has had 2 prior SAB's, both < 10 weeks.  She has no idea who her prior OB was; she only knows he was somewhere in [facility] so records regarding these miscarriages are not obtainable.  She has a family h/o blood clots (father and paternal grandmother both died from blood clots- father was in his 53's).  She had bloodwork on 09/13/07 that included lupus anticoagulant, anticardiolipin antibody, and beta-2 glycoprotein IgG and IgM that were all negative.  Habits & Providers  Alcohol-Tobacco-Diet     Tobacco Status: quit     Cigarette Packs/Day: n/a     Year Quit: 2009  Current Medications (verified): 1)  Prenatal Vitamins 0.8 Mg Tabs (Prenatal Multivit-Min-Fe-Fa) .... Take 1 Daily  Allergies (verified): 1)  ! Codeine 2)  ! Ultram (Tramadol Hcl)  Social History: Smoking Status:  quit  Review of Systems        vitals reviewed and pertinent negatives and positives seen in HPI   Physical Exam  General:  Well-developed,well-nourished,in no acute distress; alert,appropriate and cooperative throughout examination   Impression & Recommendations:  Problem # 1:  PREGNANT STATE, INCIDENTAL (ICD-V22.2) Assessment Unchanged Pt is doing well but does not have time to stay today to  get blood work done. She will return to have blood work done in the next few days.   Orders: CBC-[facility] (11914) RPR-[facility] [id number]) HIV-[facility] ([id number]) Medicaid OB visit - [facility] ([id number])  Complete Medication List: 1)  Prenatal Vitamins 0.8 Mg Tabs (Prenatal multivit-min-fe-fa) .... Take 1 daily  Patient Instructions: 1)  You are doing well with this pregancy.  2)  I love the name you have picked out.  3)  Come back early next week for lab work. Call the office to set up  time with the lab.  4)  You will need to stay for 1 hour to get the glucose test.  5)  I will see you in 2 weeks from today.     Flowsheet View for Follow-up Visit    Estimated weeks of       gestation:     30.1    Weight:     134.5    Blood pressure:   103 / 67    Headache:     No    Nausea/vomiting:   No    Edema:     0    Vaginal bleeding:   no    Vaginal discharge:   d/c    Fundal height:      28    FHR:  143    Fetal activity:     yes    Labor symptoms:   no    Fetal position:     N/A    Taking prenatal vits?   Y    Smoking:     n/a    Next visit:     2 wk    Resident:     Clotilde Dieter    Preceptor:     [place]    Comment:     will come back to get 28 wk labs    Flowsheet View for Follow-up Visit    Estimated weeks of       gestation:     30.1    Weight:     134.5    Blood pressure:   103 / 67    Hx headache?     No    Nausea/vomiting?   No    Edema?     0    Bleeding?     no    Leakage/discharge?   d/c    Fetal activity:       yes    Labor symptoms?   no    Fundal height:      28    FHR:       143    Fetal position:      N/A    Taking Vitamins?   Y    Smoking PPD:   n/a    Comment:     will come back to get 28 wk labs    Next visit:     2 wk    Resident:     Clotilde Dieter    Preceptor:     Cathey Endow

## 2010-04-28 NOTE — Assessment & Plan Note (Signed)
Summary: postpartum visit, needs pap next visit   Vital Signs:  Patient profile:   20 year old female Height:      67 inches Weight:      128.6 pounds BMI:     20.21 Temp:     98.7 degrees F oral Pulse rate:   100 / minute BP sitting:   117 / 72  (left arm) Cuff size:   regular  Vitals Entered By: Garen Grams LPN (November 26, 2009 9:38 AM) CC: postpartum visit Is Patient Diabetic? No Pain Assessment Patient in pain? no        Primary Care Provider:  Jamie Brookes MD  CC:  postpartum visit.  History of Present Illness: Pregnancy complications: none Delivery type: NSVD Vaginal bleeding: resolved for 1 week, then restarted but is tapering off Menses: not yet Contraception: Depo inj in the hospital Vaginal discharge: none Abdominal pain: no Fever/Chills: no Feeding: bottle feeding Sleep: total for 4 hours a day, taking care of Destiny Mood: normal happy mood Return to school/work: was not in school or work before pregnancy Bowel Movements: regular and normal   Habits & Providers  Alcohol-Tobacco-Diet     Tobacco Status: current     Tobacco Counseling: to quit use of tobacco products     Cigarette Packs/Day: <0.25     Year Started: 2003  Comments: smokes about 4 cigs/day  Current Medications (verified): 1)  None  Allergies (verified): 1)  ! Codeine 2)  ! Ultram (Tramadol Hcl)  Social History: Smoking Status:  current Packs/Day:  <0.25  Review of Systems        vitals reviewed and pertinent negatives and positives seen in HPI   Physical Exam  General:  Well-developed,well-nourished,in no acute distress; alert,appropriate and cooperative throughout examination Abdomen:  Bowel sounds positive,abdomen soft and non-tender without masses, organomegaly or hernias noted. uterus not felt above pelvic rim Psych:  Cognition and judgment appear intact. Alert and cooperative with normal attention span and concentration. No apparent delusions, illusions,  hallucinations   Impression & Recommendations:  Problem # 1:  POSTPARTUM EXAMINATION (ICD-V24.2) Assessment New Plan to do a pelvic exam and pap at her next visit in 1 week. Otherwise doing well, bottle feeding, using depo. Will need to discuss further birth control at next visit after pap smear.   Orders: Postpartum visit- [facility] [phone])  Patient Instructions: 1)  Since you had an abnormal pap smear in February while you were pregnant we need to do a repeat pap smear next week.  2)  Please make an appointment to have a pap smear done next week and we will test it for abnormalities. hopefully it will have normalized after the delivery.

## 2010-05-14 ENCOUNTER — Emergency Department (HOSPITAL_COMMUNITY)
Admission: EM | Admit: 2010-05-14 | Discharge: 2010-05-14 | Disposition: A | Discharge status: Home / Self Care | Payer: Self-pay | Attending: Emergency Medicine | Admitting: Emergency Medicine

## 2010-05-14 DIAGNOSIS — IMO0002 Reserved for concepts with insufficient information to code with codable children: Secondary | ICD-10-CM | POA: Insufficient documentation

## 2010-05-14 DIAGNOSIS — T169XXA Foreign body in ear, unspecified ear, initial encounter: Secondary | ICD-10-CM | POA: Insufficient documentation

## 2010-05-14 DIAGNOSIS — F411 Generalized anxiety disorder: Secondary | ICD-10-CM | POA: Insufficient documentation

## 2010-05-14 DIAGNOSIS — H9209 Otalgia, unspecified ear: Secondary | ICD-10-CM | POA: Insufficient documentation

## 2010-06-13 LAB — RPR: RPR Ser Ql: NONREACTIVE

## 2010-06-13 LAB — CBC
HCT: 36.9 % (ref 36.0–46.0)
Hemoglobin: 12.9 g/dL (ref 12.0–15.0)
MCH: 35.4 pg — ABNORMAL HIGH (ref 26.0–34.0)
MCHC: 34.8 g/dL (ref 30.0–36.0)
RBC: 3.64 MIL/uL — ABNORMAL LOW (ref 3.87–5.11)

## 2010-06-15 LAB — GLUCOSE, CAPILLARY: Glucose-Capillary: 103 mg/dL — ABNORMAL HIGH (ref 70–99)

## 2010-07-08 LAB — COMPREHENSIVE METABOLIC PANEL
AST: 20 U/L (ref 0–37)
Albumin: 4.1 g/dL (ref 3.5–5.2)
Calcium: 9.3 mg/dL (ref 8.4–10.5)
Chloride: 107 mEq/L (ref 96–112)
Creatinine, Ser: 0.84 mg/dL (ref 0.4–1.2)
GFR calc Af Amer: >60 mL/min (ref >60)
Total Bilirubin: 1.4 mg/dL — ABNORMAL HIGH (ref 0.3–1.2)
Total Protein: 6.2 g/dL (ref 6.0–8.3)

## 2010-07-08 LAB — CBC
MCV: 95.3 fL (ref 78.0–100.0)
Platelets: 270 10*3/uL (ref 150–400)
RDW: 13.3 % (ref 11.5–15.5)
WBC: 5.9 10*3/uL (ref 4.0–10.5)

## 2010-07-08 LAB — URINE MICROSCOPIC-ADD ON

## 2010-07-08 LAB — DIFFERENTIAL
Eosinophils Relative: 1 % (ref 0–5)
Lymphocytes Relative: 40 % (ref 12–46)
Lymphs Abs: 2.4 10*3/uL (ref 0.7–4.0)
Monocytes Absolute: 0.4 10*3/uL (ref 0.1–1.0)
Monocytes Relative: 6 % (ref 3–12)

## 2010-07-08 LAB — URINALYSIS, ROUTINE W REFLEX MICROSCOPIC
Glucose, UA: NEGATIVE mg/dL
Ketones, ur: NEGATIVE mg/dL
Leukocytes, UA: NEGATIVE
Protein, ur: NEGATIVE mg/dL

## 2010-07-09 LAB — CBC
Hemoglobin: 12.3 g/dL (ref 12.0–15.0)
MCHC: 33.9 g/dL (ref 30.0–36.0)
MCHC: 34.6 g/dL (ref 30.0–36.0)
MCV: 97.1 fL (ref 78.0–100.0)
MCV: 97.9 fL (ref 78.0–100.0)
Platelets: 208 10*3/uL (ref 150–400)
RBC: 2.97 MIL/uL — ABNORMAL LOW (ref 3.87–5.11)
RBC: 3.72 MIL/uL — ABNORMAL LOW (ref 3.87–5.11)
WBC: 11.3 10*3/uL — ABNORMAL HIGH (ref 4.0–10.5)
WBC: 12.8 10*3/uL — ABNORMAL HIGH (ref 4.0–10.5)

## 2010-07-09 LAB — URINALYSIS, ROUTINE W REFLEX MICROSCOPIC
Bilirubin Urine: NEGATIVE
Glucose, UA: NEGATIVE mg/dL
Hgb urine dipstick: NEGATIVE
Specific Gravity, Urine: 1.01 (ref 1.005–1.030)
pH: 6.5 (ref 5.0–8.0)

## 2010-07-09 LAB — COMPREHENSIVE METABOLIC PANEL
ALT: 15 U/L (ref 0–35)
AST: 23 U/L (ref 0–37)
CO2: 23 mEq/L (ref 19–32)
Calcium: 9.9 mg/dL (ref 8.4–10.5)
Chloride: 103 mEq/L (ref 96–112)
Creatinine, Ser: 0.49 mg/dL (ref 0.4–1.2)
GFR calc non Af Amer: >60 mL/min (ref >60)
Glucose, Bld: 83 mg/dL (ref 70–99)
Total Bilirubin: 0.6 mg/dL (ref 0.3–1.2)

## 2010-07-09 LAB — RPR: RPR Ser Ql: NONREACTIVE

## 2010-07-13 LAB — URINALYSIS, ROUTINE W REFLEX MICROSCOPIC
Glucose, UA: NEGATIVE mg/dL
Ketones, ur: NEGATIVE mg/dL
Nitrite: NEGATIVE
Protein, ur: NEGATIVE mg/dL
Urobilinogen, UA: 0.2 mg/dL (ref 0.0–1.0)

## 2010-07-13 LAB — WET PREP, GENITAL
Trich, Wet Prep: NONE SEEN
Yeast Wet Prep HPF POC: NONE SEEN

## 2010-07-13 LAB — URINE MICROSCOPIC-ADD ON

## 2010-07-24 ENCOUNTER — Ambulatory Visit (INDEPENDENT_AMBULATORY_CARE_PROVIDER_SITE_OTHER): Payer: Self-pay | Admitting: Family Medicine

## 2010-07-24 ENCOUNTER — Encounter: Payer: Self-pay | Admitting: Family Medicine

## 2010-07-24 VITALS — BP 108/72 | HR 74 | Temp 98.4°F | Ht 67.0 in | Wt 126.0 lb

## 2010-07-24 DIAGNOSIS — Z331 Pregnant state, incidental: Secondary | ICD-10-CM | POA: Insufficient documentation

## 2010-07-24 DIAGNOSIS — N921 Excessive and frequent menstruation with irregular cycle: Secondary | ICD-10-CM

## 2010-07-24 LAB — POCT URINE PREGNANCY: Preg Test, Ur: POSITIVE

## 2010-07-24 NOTE — Patient Instructions (Signed)
You are pregnant.  You should start taking some prenatal vitamins.  We are going to check a blood test on you today and again on Monday.  If the levels are going up then you are still pregnant, if they are going down then the pregnancy is terminating all by itself.

## 2010-07-24 NOTE — Assessment & Plan Note (Signed)
Pt was found to be pregnant here today. She is passing large clots and having menstruation type bleeding. She is likely losing the pregnancy.  Will get a quant today and repeat on Monday to see if the levels are going up or down.  Recommended taking prenatal vitamins.

## 2010-07-24 NOTE — Progress Notes (Signed)
Pt was found to be pregnant here today. She is passing large clots and having menstruation type bleeding. She has just had a baby in the lat 6 months or so. She was not planning to have another baby yet but has not been back to get her Depo shot since Oct. She did not call or request an alternate form of birth control either. She says she has been using condoms "every time". Her last LMP was 06-18-10 and she started bleeding with large quarter dollar sized clots on 07-21-10. She says the clots are painful to pass.   ROS: neg except as noted above.   PE: GEN: pt appears comfortable sitting on table. GU: smeared blood seen on legs and vaginal opening, blood with small clots seen in vaginal vault. No signs of tears or lesions in the vagina. Suspect all bleeding is coming from inside the cervix.

## 2010-07-25 LAB — HCG, QUANTITATIVE, PREGNANCY: hCG, Beta Chain, Quant, S: 389.2 m[IU]/mL

## 2010-07-27 ENCOUNTER — Other Ambulatory Visit: Payer: Self-pay

## 2010-07-27 DIAGNOSIS — Z331 Pregnant state, incidental: Secondary | ICD-10-CM

## 2010-07-27 NOTE — Progress Notes (Signed)
B-HCG DONE TODAY Ryshawn Sanzone

## 2010-07-28 ENCOUNTER — Telehealth: Payer: Self-pay | Admitting: *Deleted

## 2010-07-28 NOTE — Telephone Encounter (Signed)
Pt returned call

## 2010-07-28 NOTE — Telephone Encounter (Signed)
Spoke with patient and informed of below 

## 2010-07-28 NOTE — Telephone Encounter (Signed)
LVM for patient to call back. ?

## 2010-07-28 NOTE — Telephone Encounter (Signed)
Message copied by Jimmy Footman on Tue Jul 28, 2010  2:44 PM ------      Message from: Jamie Brookes      Created: Tue Jul 28, 2010  2:34 PM      Regarding: Please call this patient       I called her and I think I spoke with her mom but I just said that we would call back to give the lab results to [facility].       Please try to call back and let Kimberly Bradley know that her levels of the pregnancy hormone are going down which means the pregnancy is being lost.       Tell her we need to discuss what kind of birth control she is going to start so she needs to call me back with the name of what she wants to use or come back in so we can discuss her birth control options.       Thanks            Hospital doctor            ----- Message -----         From: Carney Living, MD         Sent: 07/28/2010   8:22 AM           To: Jamie Brookes                        ----- Message -----         From: [facility]         Sent: 07/27/2010   9:52 PM           To: Carney Living, MD

## 2010-07-31 ENCOUNTER — Ambulatory Visit (INDEPENDENT_AMBULATORY_CARE_PROVIDER_SITE_OTHER): Payer: Self-pay | Admitting: Family Medicine

## 2010-07-31 ENCOUNTER — Encounter: Payer: Self-pay | Admitting: Family Medicine

## 2010-07-31 VITALS — BP 129/70 | HR 58 | Temp 98.6°F | Ht 67.0 in | Wt 122.4 lb

## 2010-07-31 DIAGNOSIS — Z309 Encounter for contraceptive management, unspecified: Secondary | ICD-10-CM

## 2010-07-31 DIAGNOSIS — O039 Complete or unspecified spontaneous abortion without complication: Secondary | ICD-10-CM

## 2010-07-31 MED ORDER — MEDROXYPROGESTERONE ACETATE 150 MG/ML IM SUSP
150.0000 mg | Freq: Once | INTRAMUSCULAR | Status: AC
  Administered 2010-07-31: 150 mg via INTRAMUSCULAR

## 2010-07-31 NOTE — Progress Notes (Signed)
Since pt has had recent passing of POC depo will be given until mirena is placed. Spoke with dr. Perley Jain before depo given to confirm plan of action.Kimberly Bradley

## 2010-07-31 NOTE — Patient Instructions (Signed)
You are getting the depo today (which requires a pregnancy test).  Please contact the below support group if you need additional support about the miscarriage.  [facility] [place]. [address], [place], [place] [address], Phone: [phone].  Think about the Mirena over the next week and let us know what you decide.

## 2010-08-06 NOTE — Assessment & Plan Note (Addendum)
Pt has completed the miscarriage.  Discussed birth control options today.  She will get the Depo shot today, will then come back to get the Mirena put in. Given paperwork to fill out and send in to get the Mirena for an affordable price.  Pt given info about support groups for people who have lost a pregnancy.

## 2010-08-06 NOTE — Progress Notes (Signed)
Spontaneous Abortion: Pt says that she has passed some large clots and is not longer bleeding. Her bHCG quant is decreasing. She is saddened that she lost the pregnancy even though she did not plan it and wants to get started on a form of birth control today. We discussed all the forms of birth control today and what her options are. We also discussed her support system. She has the father of the baby, her mom and her sister.   ROs: neg except some sadness over the loss of the fetus.

## 2010-09-17 ENCOUNTER — Encounter: Payer: Self-pay | Admitting: Family Medicine

## 2010-09-17 ENCOUNTER — Ambulatory Visit (INDEPENDENT_AMBULATORY_CARE_PROVIDER_SITE_OTHER): Payer: Self-pay | Admitting: Family Medicine

## 2010-09-17 VITALS — BP 116/75 | HR 106 | Temp 98.1°F | Ht 67.0 in | Wt 116.9 lb

## 2010-09-17 DIAGNOSIS — N898 Other specified noninflammatory disorders of vagina: Secondary | ICD-10-CM

## 2010-09-17 DIAGNOSIS — N939 Abnormal uterine and vaginal bleeding, unspecified: Secondary | ICD-10-CM

## 2010-09-17 DIAGNOSIS — R109 Unspecified abdominal pain: Secondary | ICD-10-CM

## 2010-09-17 LAB — CBC
MCH: 32.5 pg (ref 26.0–34.0)
MCHC: 34.9 g/dL (ref 30.0–36.0)
MCV: 93.3 fL (ref 78.0–100.0)
Platelets: 256 10*3/uL (ref 150–400)
RBC: 4.46 MIL/uL (ref 3.87–5.11)
RDW: 12.5 % (ref 11.5–15.5)

## 2010-09-17 NOTE — Patient Instructions (Signed)
You need to come in to get a Depo shot before Aug 4th unless you start some other type of birth control before that time. I would suggest using ibuprofen (400 mg every 4-6 hours as needed for cramping).  The bleeding can be irregular (in amount and length of bleeding) on Depo.  I think this is a normal course of the medicine in your body.  We will check a CBC today to make sure your hemoglobin is not dropping.

## 2010-09-18 ENCOUNTER — Encounter: Payer: Self-pay | Admitting: Family Medicine

## 2010-09-18 DIAGNOSIS — R109 Unspecified abdominal pain: Secondary | ICD-10-CM | POA: Insufficient documentation

## 2010-09-18 NOTE — Progress Notes (Signed)
Abd cramps: Pt has been having abdominal cramping and some spotting for the last 2 weeks. She says the cramping is a little worse than her normal menstral cramps. This is the first cycle she has had since starting the depo in the beginning of May. She has been having spotting (about 2 pads a day) for the last 2 weeks. She says the spotting goes off and on during the day but has been every day for the last 2 weeks. She is not taking any meds for it. She had a spontaneous Ab about 6 weeks ago. No new sexual partners.   ROS: neg except as noted above.  PE:  Gen: seated comfortably.  GU: normal vaginal vault, minimal traces of old blood int he vault, none seen at the opening of the cervix. No tenderness with palpation of uterus or ovaries. No discharge noted or foul smell.

## 2010-09-18 NOTE — Assessment & Plan Note (Signed)
PT is having some menstrual cramps and an unusually long (2 weeks) bleeding after just starting Depo.  Plan to treat pain with Ibuprofen that she already has and check CBC today to make sure she is not losing too much blood and becoming anemic

## 2010-12-21 LAB — CBC
MCHC: 34.3
MCV: 93.7
RBC: 4.14
RDW: 12.8

## 2010-12-21 LAB — WET PREP, GENITAL
Trich, Wet Prep: NONE SEEN
WBC, Wet Prep HPF POC: NONE SEEN
Yeast Wet Prep HPF POC: NONE SEEN

## 2010-12-21 LAB — DIFFERENTIAL
Lymphocytes Relative: 3 — ABNORMAL LOW
Lymphs Abs: 0.4 — ABNORMAL LOW
Monocytes Relative: 5
Neutrophils Relative %: 92 — ABNORMAL HIGH

## 2010-12-21 LAB — COMPREHENSIVE METABOLIC PANEL
AST: 15
CO2: 27
Calcium: 8.9
Creatinine, Ser: 0.73
GFR calc Af Amer: >60
GFR calc non Af Amer: >60
Glucose, Bld: 96
Total Protein: 6

## 2010-12-21 LAB — PREGNANCY, URINE: Preg Test, Ur: NEGATIVE

## 2010-12-21 LAB — URINALYSIS, ROUTINE W REFLEX MICROSCOPIC
Bilirubin Urine: NEGATIVE
Hgb urine dipstick: NEGATIVE
Ketones, ur: NEGATIVE
Nitrite: NEGATIVE
Specific Gravity, Urine: 1.021
Urobilinogen, UA: 0.2

## 2010-12-21 LAB — GC/CHLAMYDIA PROBE AMP, GENITAL: Chlamydia, DNA Probe: NEGATIVE

## 2010-12-25 LAB — CBC
HCT: 40
Hemoglobin: 13.6
MCHC: 33.9
MCV: 96.5
RBC: 4.15

## 2010-12-25 LAB — URINALYSIS, ROUTINE W REFLEX MICROSCOPIC
Bilirubin Urine: NEGATIVE
Ketones, ur: 15 — AB
Nitrite: NEGATIVE
pH: 6

## 2010-12-25 LAB — DIFFERENTIAL
Basophils Relative: 0
Eosinophils Absolute: 0
Eosinophils Relative: 0
Monocytes Absolute: 0.6
Monocytes Relative: 5
Neutrophils Relative %: 77

## 2010-12-25 LAB — WET PREP, GENITAL

## 2010-12-25 LAB — GC/CHLAMYDIA PROBE AMP, GENITAL
Chlamydia, DNA Probe: NEGATIVE
GC Probe Amp, Genital: NEGATIVE

## 2010-12-29 LAB — URINALYSIS, ROUTINE W REFLEX MICROSCOPIC
Ketones, ur: NEGATIVE
Nitrite: NEGATIVE
Protein, ur: NEGATIVE

## 2010-12-29 LAB — GLUCOSE, CAPILLARY: Glucose-Capillary: 84

## 2010-12-30 LAB — URINALYSIS, ROUTINE W REFLEX MICROSCOPIC
Bilirubin Urine: NEGATIVE
Hgb urine dipstick: NEGATIVE
Ketones, ur: NEGATIVE
Nitrite: NEGATIVE
Urobilinogen, UA: 0.2

## 2010-12-30 LAB — WET PREP, GENITAL
Clue Cells Wet Prep HPF POC: NONE SEEN
Yeast Wet Prep HPF POC: NONE SEEN

## 2011-03-30 NOTE — L&D Delivery Note (Signed)
Delivery Note At 2:35 AM a viable female was delivered via Vaginal, Spontaneous Delivery (Presentation:vertex ; Occiput Anterior).  APGAR: 9, 9; weight 5 lb 5.2 oz (2415 g).   Placenta status: Intact, Spontaneous.  Cord: 3 vessels with the following complications: None.   Anesthesia: Epidural  Episiotomy: None Lacerations: None Est. Blood Loss (mL): 300  Mom to postpartum.  Baby to nursery-stable.  Ginger Organ E 03/14/2012, 3:01 AM

## 2011-03-30 NOTE — L&D Delivery Note (Signed)
I was present for entire delivery and agree with above note. Sima Lindenberger, MD 

## 2011-05-18 ENCOUNTER — Ambulatory Visit (INDEPENDENT_AMBULATORY_CARE_PROVIDER_SITE_OTHER): Payer: Self-pay | Admitting: Sports Medicine

## 2011-05-18 ENCOUNTER — Encounter: Payer: Self-pay | Admitting: Sports Medicine

## 2011-05-18 DIAGNOSIS — H9209 Otalgia, unspecified ear: Secondary | ICD-10-CM

## 2011-05-18 DIAGNOSIS — H9190 Unspecified hearing loss, unspecified ear: Secondary | ICD-10-CM | POA: Insufficient documentation

## 2011-05-18 NOTE — Assessment & Plan Note (Signed)
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  Will refer to ENT ASAP; no signs of active infection or toxicity; no acute treatment indicated at this time.

## 2011-05-18 NOTE — Patient Instructions (Addendum)
Kimberly Bradley I am concerned about your hearing loss especially in light of your family history.  I am going to refer you to an Ear, Nose and Throat (ENT) doctor ASAP.  Our office will contact you regarding an appointment and encourage you to make this a priority to get to this appointment.    If anything changes before your appointment with ENT and you feel like you need to be re-evaluated please let us know and come back to be seen.

## 2011-05-18 NOTE — Progress Notes (Signed)
Patient ID: Kimberly Bradley, female   DOB: 10/20/1990, 21 y.o.   MRN: [id number] Subjective:  Kimberly Bradley is a 21 y.o. female presenting today for evaluation of B hearing loss with L worse than R.  She has noticed a steady decline in her hearing over the past month and reports episodic complete hearing loss with incomplete resolution occuring multiple times per day.  She has been relying on lip reading.  Family history positive for hearing loss in mother (at birth) and sister (progressive loss between 78 and 69).  She denies any new medications, supplements, exposure to toxic agents or recent illness.     ROS  Constitutional No fatigue or recent illness  Infectious No fevers, chills,   Resp No cough, congestion, continues to smoke 5-6 cigarettes/day  Cardiac No chest pain  GI No nausea, vomiting or reported constipation/diarrhea  GU No reported urinary symptoms  Skin No rash reported  Neuro No vestibular disturbances, no syncope/presyncope, no changes in vision, smell or taste  Trauma None reported  Diet No changes  MEDS No new meds or herbals    Past Medical Hx Reviewed: yes Medications Reviewed: yes Family History Reviewed: yes   PE: GENERAL:  Adult Female examined in [facility], in NAD, in no resp distress, attentive to conversation HNEENT:  H&N: supple, no adenopathy and trachea midline OROPHARYNX: lips, mucosa, and tongue normal; teeth and gums normal EYES: EOMi, no scleral icterus EARS: normal TM's and external ear canals both ears NOSE: normal THORAX: HEART: RRR, S1/S2 heard, no murmur LUNGS: CTA B, no wheezes, no crackles >NEURO:  Whisper test: pt able to discern on R not on L  [place]: able to hear 512 on R; unable to hear 128 on R or 128&512 on L  [place]: Unable to hear B (512 and 128)  [place]: Unable to hear either B (512 and 128)

## 2011-05-19 ENCOUNTER — Telehealth: Payer: Self-pay | Admitting: *Deleted

## 2011-05-19 NOTE — Telephone Encounter (Signed)
Attempted to call patient

## 2011-05-20 ENCOUNTER — Telehealth: Payer: Self-pay | Admitting: *Deleted

## 2011-05-20 NOTE — Telephone Encounter (Signed)
Spoke with patient and informed her ENT referrral set up. Appointment is Thursday 2/28 @2 :40pm, patient is to arrive @ 2:25pm. [facility]. [address] phone number is [phone]. "PATIENT DOES NOT HAVE INSURANCE AND AGREES TO THE FACT THAT SHE WILL HAVE TO PAY $100 UP FRONT TO SEE DOCTOR" Office notes and referral faxed to [phone].Kimberly Bradley

## 2011-10-19 ENCOUNTER — Ambulatory Visit: Payer: Self-pay | Admitting: Sports Medicine

## 2011-11-16 ENCOUNTER — Telehealth: Payer: Self-pay | Admitting: *Deleted

## 2011-11-16 NOTE — Telephone Encounter (Signed)
Received call per [employer] from patient's mother stating patient is pregnant and called and was told she would have to pay $ 50.00 up front before she is seen to check out baby. Not sure if she called here. Advised mother that I will need to speak with patient. Phone # given [phone] to call patient back.    Spoke with patient . States her LMP was in March .  Has done a home pregnancy test. States she is starting to show now.  Has not seen a doctor regarding this pregnancy.  She has applied for medicaid.  Consulted with Britta Mccreedy and she advises we can schedule appointment and patient will need to bring [name] medicaid card within 45 days of application.   Appointment scheduled for tomorrow for verification of pregnancy and to be set up for labs and OB care.

## 2011-11-17 ENCOUNTER — Encounter: Payer: Self-pay | Admitting: Family Medicine

## 2011-11-17 ENCOUNTER — Ambulatory Visit (INDEPENDENT_AMBULATORY_CARE_PROVIDER_SITE_OTHER): Payer: Medicaid Other | Admitting: Family Medicine

## 2011-11-17 VITALS — BP 110/63 | HR 79 | Temp 98.1°F | Wt 126.0 lb

## 2011-11-17 DIAGNOSIS — Z331 Pregnant state, incidental: Secondary | ICD-10-CM

## 2011-11-17 DIAGNOSIS — Z3480 Encounter for supervision of other normal pregnancy, unspecified trimester: Secondary | ICD-10-CM | POA: Insufficient documentation

## 2011-11-17 DIAGNOSIS — N912 Amenorrhea, unspecified: Secondary | ICD-10-CM

## 2011-11-17 DIAGNOSIS — Z3490 Encounter for supervision of normal pregnancy, unspecified, unspecified trimester: Secondary | ICD-10-CM

## 2011-11-17 LAB — POCT URINE PREGNANCY: Preg Test, Ur: POSITIVE

## 2011-11-17 NOTE — Patient Instructions (Addendum)
Once we get the Medicaid set up, let us know.  We will get you set up ultrasound at that point as well as prenatal labs.   Pregnancy If you are planning on getting pregnant, it is a good idea to make a preconception appointment with your care- giver to discuss having a healthy lifestyle before getting pregnant. Such as, diet, weight, exercise, taking prenatal vitamins especially folic acid (it helps prevent brain and spinal cord defects), avoiding alcohol, smoking and illegal drugs, medical problems (diabetes, convulsions), family history of genetic problems, working conditions and immunizations. It is better to have knowledge of these things and do something about them before getting pregnant. In your pregnancy, it is important to follow certain guidelines to have a healthy baby. It is very important to get good prenatal care and follow your caregiver's instructions. Prenatal care includes all the medical care you receive before your baby's birth. This helps to prevent problems during the pregnancy and childbirth. HOME CARE INSTRUCTIONS   Start your prenatal visits by the 12th week of pregnancy or before when possible. They are usually scheduled monthly at first. They are more often in the last 2 months before delivery. It is important that you keep your caregiver's appointments and follow your caregiver's instructions regarding medication use, exercise, and diet.   During pregnancy, you are providing food for you and your baby. Eat a regular, well-balanced diet. Choose foods such as meat, fish, milk and other dairy products, vegetables, fruits, whole-grain breads and cereals. Your caregiver will inform you of the ideal weight gain depending on your current height and weight. Drink lots of liquids. Try to drink 8 glasses of water a day.   Alcohol is associated with a number of birth defects including fetal alcohol syndrome. It is best to avoid alcohol completely. Smoking will cause low birth rate and  prematurity. Use of alcohol and nicotine during your pregnancy also increases the chances that your child will be chemically dependent later in their life and may contribute to SIDS (Sudden Infant Death Syndrome).   Do not use illegal drugs.   Only take prescription or over-the-counter medications that are recommended by your caregiver. Other medications can cause genetic and physical problems in the baby.   Morning sickness can often be helped by keeping soda crackers at the bedside. Eat a couple before arising in the morning.   A sexual relationship may be continued until near the end of pregnancy if there are no other problems such as early (premature) leaking of amniotic fluid from the membranes, vaginal bleeding, painful intercourse or belly (abdominal) pain.   Exercise regularly. Check with your caregiver if you are unsure of the safety of some of your exercises.   Do not use hot tubs, steam rooms or saunas. These increase the risk of fainting or passing out and hurting yourself and the baby. Swimming is OK for exercise. Get plenty of rest, including afternoon naps when possible especially in the third trimester.   Avoid toxic odors and chemicals.   Do not wear high heels. They may cause you to lose your balance and fall.   Do not lift over 5 pounds. If you do lift anything, lift with your legs and thighs, not your back.   Avoid long trips, especially in the third trimester.   If you have to travel out of the city or state, take a copy of your medical records with you.  SEEK IMMEDIATE MEDICAL CARE IF:   You develop an unexplained  oral temperature above 102 F (38.9 C), or as your caregiver suggests.   You have leaking of fluid from the vagina. If leaking membranes are suspected, take your temperature and inform your caregiver of this when you call.   There is vaginal spotting or bleeding. Notify your caregiver of the amount and how many pads are used.   You continue to feel sick  to your stomach (nauseous) and have no relief from remedies suggested, or you throw up (vomit) blood or coffee ground like materials.   You develop upper abdominal pain.   You have round ligament discomfort in the lower abdominal area. This still must be evaluated by your caregiver.   You feel contractions of the uterus.   You do not feel the baby move, or there is less movement than before.   You have painful urination.   You have abnormal vaginal discharge.   You have persistent diarrhea.   You get a severe headache.   You have problems with your vision.   You develop muscle weakness.   You feel dizzy and faint.   You develop shortness of breath.   You develop chest pain.   You have back pain that travels down to your leg and feet.   You feel irregular or a very fast heartbeat.   You develop excessive weight gain in a short period of time (5 pounds in 3 to 5 days).   You are involved with a domestic violence situation.  Document Released: 03/15/2005 Document Revised: 03/04/2011 Document Reviewed: 09/06/2008 [facility] Patient Information 2012 [place], [place].

## 2011-11-17 NOTE — Progress Notes (Signed)
Patient ID: Kimberly Bradley, female   DOB: 09-03-1990, 21 y.o.   MRN: [id number] Kimberly Bradley is a 21 y.o. female who presents to [facility] today for amenorrhea.  1. Pregnancy:  Patient with LMP March 13 (not completely sure of date).  Took pregnancy test in early August and it was pregnant.  Did not know she was pregnant prior to that, although she was worried about this due to lack of menses.  Some mild nausea but no vomiting.  Eating and drinking well.  NO vaginal bleeding, vaginal discharge, dysuria.  Was not on birth control, last Depo was in 2011 even though it is listed on med list.  Has 7 and 23 year old at home who are both healthy.  Father of baby involved.    The following portions of the patient's history were reviewed and updated as appropriate: allergies, current medications, past medical history, family and social history, and problem list.  Patient is a nonsmoker.  No past medical history on file.  ROS as above otherwise neg. No Chest pain, palpitations, SOB, Fever, Chills, Abd pain, N/V/D.  Medications reviewed. Current Outpatient Prescriptions  Medication Sig Dispense Refill  . MedroxyPROGESTERone Acetate (DEPO-PROVERA IM) Injection every 3 months for contraception.         Exam:  BP 110/63  Pulse 79  Temp 98.1 F (36.7 C) (Oral)  Wt 126 lb (57.153 kg) Gen: Well NAD HEENT: EOMI,  MMM Lungs: CTABL Nl WOB Heart: RRR no MRG Abd:  Fundal heights consistent with dates (22 cm today).  FHTs good - 159 bpm.  Good bowel sounds.  No pain on palpation. Exts: Non edematous BL  LE, warm and well perfused.   No results found for this or any previous visit (from the past 72 hour(s)).

## 2011-11-17 NOTE — Assessment & Plan Note (Addendum)
Based on LMP she is 23 weeks today. Due date is March 15, 2012. Provided her letter from our office stating that she is pregnant so she can obtain Medicaid. She is to follow up with [name] after she has obtained Medicaid.   Will need prenatal labs and ultrasound at that time.   FU with PCP in 4 weeks, but sooner for labs/US.   Prenatal precautions discussed orally and provided in written form.

## 2011-12-08 ENCOUNTER — Telehealth: Payer: Self-pay | Admitting: Family Medicine

## 2011-12-08 DIAGNOSIS — Z3490 Encounter for supervision of normal pregnancy, unspecified, unspecified trimester: Secondary | ICD-10-CM

## 2011-12-08 NOTE — Telephone Encounter (Signed)
Called and set up appt. However her pcp is not avail. Kimberly Bradley, Kimberly Bradley

## 2011-12-08 NOTE — Telephone Encounter (Signed)
Thanks just wanted to make sure she is seen.

## 2011-12-08 NOTE — Telephone Encounter (Signed)
Good morning, would you guys be able to call Kimberly Bradley and make sure she's set up for an appointment next week (it will be 4 weeks at that point from her last one) and that she's scheduled for prenatal labs?  I'll put the orders in.  Thanks,

## 2011-12-10 ENCOUNTER — Telehealth: Payer: Self-pay | Admitting: Sports Medicine

## 2011-12-10 NOTE — Telephone Encounter (Signed)
Is able to get her [name] now - would like for nurse to set it up and call her with appt.

## 2011-12-13 ENCOUNTER — Other Ambulatory Visit (HOSPITAL_COMMUNITY)
Admission: RE | Admit: 2011-12-13 | Discharge: 2011-12-13 | Disposition: A | Discharge status: Home / Self Care | Payer: Medicaid Other | Source: Ambulatory Visit | Attending: Family Medicine | Admitting: Family Medicine

## 2011-12-13 ENCOUNTER — Ambulatory Visit (INDEPENDENT_AMBULATORY_CARE_PROVIDER_SITE_OTHER): Payer: Medicaid Other | Admitting: Family Medicine

## 2011-12-13 ENCOUNTER — Encounter: Payer: Self-pay | Admitting: Family Medicine

## 2011-12-13 VITALS — BP 102/64 | Temp 98.9°F | Wt 129.5 lb

## 2011-12-13 DIAGNOSIS — Z01419 Encounter for gynecological examination (general) (routine) without abnormal findings: Secondary | ICD-10-CM | POA: Insufficient documentation

## 2011-12-13 DIAGNOSIS — Z3480 Encounter for supervision of other normal pregnancy, unspecified trimester: Secondary | ICD-10-CM

## 2011-12-13 DIAGNOSIS — Z3490 Encounter for supervision of normal pregnancy, unspecified, unspecified trimester: Secondary | ICD-10-CM

## 2011-12-13 DIAGNOSIS — Z113 Encounter for screening for infections with a predominantly sexual mode of transmission: Secondary | ICD-10-CM | POA: Insufficient documentation

## 2011-12-13 LAB — POCT WET PREP (WET MOUNT)

## 2011-12-13 LAB — OB RESULTS CONSOLE GC/CHLAMYDIA
Chlamydia: NEGATIVE
Gonorrhea: NEGATIVE

## 2011-12-13 NOTE — Progress Notes (Signed)
21 yo G4P2012 at [redacted] weeks GA by LMP here today for initial prenatal visit.  She was seen by me about 4 weeks ago for confirmatory testing of pregnancy -- positive pregnancy test  No red flags by history today.  Small for gestational age using LMP.  Ultrasound order placed today for gestational age and any anomalies.   No previous problems in pregnancy.  Patient to schedule [facility] appointment today for 2-3 weeks, would rather this be done earlier than later b/c she is presenting so late for care.   Initial labs today.  Will need 1 hour Glucola next visit.  Discussed this with patient. Precautions reviewed today.  Genetic screening and history reviewed today.

## 2011-12-13 NOTE — Telephone Encounter (Signed)
Patient was in today for OB visit, this was addressed then

## 2011-12-13 NOTE — Patient Instructions (Addendum)
Make an appointment to be seen at [facility] here at [facility] in the next 2-3 weeks.   Your next visit will be an hour long.  You will need to do the sugar test for diabetes then.    After you are seen at [facility], you will meet Dr. Berline Chough 2 weeks later.  It was good to see you today.  If you have any vaginal bleeding, cramping, pain, fevers, or not feeling the baby move please come back here immediately or go to [facility] at [facility].  Pregnancy - Second Trimester The second trimester of pregnancy (3 to 6 months) is a period of rapid growth for you and your baby. At the end of the sixth month, your baby is about 9 inches long and weighs 1 1/2 pounds. You will begin to feel the baby move between 18 and 20 weeks of the pregnancy. This is called quickening. Weight gain is faster. A clear fluid (colostrum) may leak out of your breasts. You may feel small contractions of the womb (uterus). This is known as false labor or Braxton-Hicks contractions. This is like a practice for labor when the baby is ready to be born. Usually, the problems with morning sickness have usually passed by the end of your first trimester. Some women develop small dark blotches (called cholasma, mask of pregnancy) on their face that usually goes away after the baby is born. Exposure to the sun makes the blotches worse. Acne may also develop in some pregnant women and pregnant women who have acne, may find that it goes away. PRENATAL EXAMS  Blood work may continue to be done during prenatal exams. These tests are done to check on your health and the probable health of your baby. Blood work is used to follow your blood levels (hemoglobin). Anemia (low hemoglobin) is common during pregnancy. Iron and vitamins are given to help prevent this. You will also be checked for diabetes between 24 and 28 weeks of the pregnancy. Some of the previous blood tests may be repeated.   The size of the uterus is measured during each visit. This is to  make sure that the baby is continuing to grow properly according to the dates of the pregnancy.   Your blood pressure is checked every prenatal visit. This is to make sure you are not getting toxemia.   Your urine is checked to make sure you do not have an infection, diabetes or protein in the urine.   Your weight is checked often to make sure gains are happening at the suggested rate. This is to ensure that both you and your baby are growing normally.   Sometimes, an ultrasound is performed to confirm the proper growth and development of the baby. This is a test which bounces harmless sound waves off the baby so your caregiver can more accurately determine due dates.  Sometimes, a specialized test is done on the amniotic fluid surrounding the baby. This test is called an amniocentesis. The amniotic fluid is obtained by sticking a needle into the belly (abdomen). This is done to check the chromosomes in instances where there is a concern about possible genetic problems with the baby. It is also sometimes done near the end of pregnancy if an early delivery is required. In this case, it is done to help make sure the baby's lungs are mature enough for the baby to live outside of the womb. CHANGES OCCURING IN THE SECOND TRIMESTER OF PREGNANCY Your body goes through many  changes during pregnancy. They vary from person to person. Talk to your caregiver about changes you notice that you are concerned about.  During the second trimester, you will likely have an increase in your appetite. It is normal to have cravings for certain foods. This varies from person to person and pregnancy to pregnancy.   Your lower abdomen will begin to bulge.   You may have to urinate more often because the uterus and baby are pressing on your bladder. It is also common to get more bladder infections during pregnancy (pain with urination). You can help this by drinking lots of fluids and emptying your bladder before and after  intercourse.   You may begin to get stretch marks on your hips, abdomen, and breasts. These are normal changes in the body during pregnancy. There are no exercises or medications to take that prevent this change.   You may begin to develop swollen and bulging veins (varicose veins) in your legs. Wearing support hose, elevating your feet for 15 minutes, 3 to 4 times a day and limiting salt in your diet helps lessen the problem.   Heartburn may develop as the uterus grows and pushes up against the stomach. Antacids recommended by your caregiver helps with this problem. Also, eating smaller meals 4 to 5 times a day helps.   Constipation can be treated with a stool softener or adding bulk to your diet. Drinking lots of fluids, vegetables, fruits, and whole grains are helpful.   Exercising is also helpful. If you have been very active up until your pregnancy, most of these activities can be continued during your pregnancy. If you have been less active, it is helpful to start an exercise program such as walking.   Hemorrhoids (varicose veins in the rectum) may develop at the end of the second trimester. Warm sitz baths and hemorrhoid cream recommended by your caregiver helps hemorrhoid problems.   Backaches may develop during this time of your pregnancy. Avoid heavy lifting, wear low heal shoes and practice good posture to help with backache problems.   Some pregnant women develop tingling and numbness of their hand and fingers because of swelling and tightening of ligaments in the wrist (carpel tunnel syndrome). This goes away after the baby is born.   As your breasts enlarge, you may have to get a bigger bra. Get a comfortable, cotton, support bra. Do not get a nursing bra until the last month of the pregnancy if you will be nursing the baby.   You may get a dark line from your belly button to the pubic area called the linea nigra.   You may develop rosy cheeks because of increase blood flow to the  face.   You may develop spider looking lines of the face, neck, arms and chest. These go away after the baby is born.  HOME CARE INSTRUCTIONS   It is extremely important to avoid all smoking, herbs, alcohol, and unprescribed drugs during your pregnancy. These chemicals affect the formation and growth of the baby. Avoid these chemicals throughout the pregnancy to ensure the delivery of a healthy infant.   Most of your home care instructions are the same as suggested for the first trimester of your pregnancy. Keep your caregiver's appointments. Follow your caregiver's instructions regarding medication use, exercise and diet.   During pregnancy, you are providing food for you and your baby. Continue to eat regular, well-balanced meals. Choose foods such as meat, fish, milk and other low fat dairy products,  vegetables, fruits, and whole-grain breads and cereals. Your caregiver will tell you of the ideal weight gain.   A physical sexual relationship may be continued up until near the end of pregnancy if there are no other problems. Problems could include early (premature) leaking of amniotic fluid from the membranes, vaginal bleeding, abdominal pain, or other medical or pregnancy problems.   Exercise regularly if there are no restrictions. Check with your caregiver if you are unsure of the safety of some of your exercises. The greatest weight gain will occur in the last 2 trimesters of pregnancy. Exercise will help you:   Control your weight.   Get you in shape for labor and delivery.   Lose weight after you have the baby.   Wear a good support or jogging bra for breast tenderness during pregnancy. This may help if worn during sleep. Pads or tissues may be used in the bra if you are leaking colostrum.   Do not use hot tubs, steam rooms or saunas throughout the pregnancy.   Wear your seat belt at all times when driving. This protects you and your baby if you are in an accident.   Avoid raw meat,  uncooked cheese, cat litter boxes and soil used by cats. These carry germs that can cause birth defects in the baby.   The second trimester is also a good time to visit your dentist for your dental health if this has not been done yet. Getting your teeth cleaned is OK. Use a soft toothbrush. Brush gently during pregnancy.   It is easier to loose urine during pregnancy. Tightening up and strengthening the pelvic muscles will help with this problem. Practice stopping your urination while you are going to the bathroom. These are the same muscles you need to strengthen. It is also the muscles you would use as if you were trying to stop from passing gas. You can practice tightening these muscles up 10 times a set and repeating this about 3 times per day. Once you know what muscles to tighten up, do not perform these exercises during urination. It is more likely to contribute to an infection by backing up the urine.   Ask for help if you have financial, counseling or nutritional needs during pregnancy. Your caregiver will be able to offer counseling for these needs as well as refer you for other special needs.   Your skin may become oily. If so, wash your face with mild soap, use non-greasy moisturizer and oil or cream based makeup.  MEDICATIONS AND DRUG USE IN PREGNANCY  Take prenatal vitamins as directed. The vitamin should contain 1 milligram of folic acid. Keep all vitamins out of reach of children. Only a couple vitamins or tablets containing iron may be fatal to a baby or young child when ingested.   Avoid use of all medications, including herbs, over-the-counter medications, not prescribed or suggested by your caregiver. Only take over-the-counter or prescription medicines for pain, discomfort, or fever as directed by your caregiver. Do not use aspirin.   Let your caregiver also know about herbs you may be using.   Alcohol is related to a number of birth defects. This includes fetal alcohol  syndrome. All alcohol, in any form, should be avoided completely. Smoking will cause low birth rate and premature babies.   Street or illegal drugs are very harmful to the baby. They are absolutely forbidden. A baby born to an addicted mother will be addicted at birth. The baby will go through  the same withdrawal an adult does.  SEEK MEDICAL CARE IF:  You have any concerns or worries during your pregnancy. It is better to call with your questions if you feel they cannot wait, rather than worry about them. SEEK IMMEDIATE MEDICAL CARE IF:   An unexplained oral temperature above 102 F (38.9 C) develops, or as your caregiver suggests.   You have leaking of fluid from the vagina (birth canal). If leaking membranes are suspected, take your temperature and tell your caregiver of this when you call.   There is vaginal spotting, bleeding, or passing clots. Tell your caregiver of the amount and how many pads are used. Light spotting in pregnancy is common, especially following intercourse.   You develop a bad smelling vaginal discharge with a change in the color from clear to white.   You continue to feel sick to your stomach (nauseated) and have no relief from remedies suggested. You vomit blood or coffee ground-like materials.   You lose more than 2 pounds of weight or gain more than 2 pounds of weight over 1 week, or as suggested by your caregiver.   You notice swelling of your face, hands, feet, or legs.   You get exposed to [place] measles and have never had them.   You are exposed to fifth disease or chickenpox.   You develop belly (abdominal) pain. Round ligament discomfort is a common non-cancerous (benign) cause of abdominal pain in pregnancy. Your caregiver still must evaluate you.   You develop a bad headache that does not go away.   You develop fever, diarrhea, pain with urination, or shortness of breath.   You develop visual problems, blurry, or double vision.   You fall or are  in a car accident or any kind of trauma.   There is mental or physical violence at home.  Document Released: 03/09/2001 Document Revised: 03/04/2011 Document Reviewed: 09/11/2008 [facility] Patient Information 2012 [place], [place].

## 2011-12-13 NOTE — Telephone Encounter (Signed)
LM through interpreter for patient to call back to discuss below. Does patient have insurance and if so we will need her to bring updated copy before [name] appointment can be made

## 2011-12-14 ENCOUNTER — Telehealth: Payer: Self-pay | Admitting: Sports Medicine

## 2011-12-14 LAB — OBSTETRIC PANEL
Antibody Screen: NEGATIVE
Basophils Relative: 0 % (ref 0–1)
HCT: 32.7 % — ABNORMAL LOW (ref 36.0–46.0)
Hemoglobin: 11.3 g/dL — ABNORMAL LOW (ref 12.0–15.0)
Hepatitis B Surface Ag: NEGATIVE
Lymphocytes Relative: 23 % (ref 12–46)
Lymphs Abs: 1.9 10*3/uL (ref 0.7–4.0)
MCHC: 34.6 g/dL (ref 30.0–36.0)
Monocytes Absolute: 0.5 10*3/uL (ref 0.1–1.0)
Monocytes Relative: 6 % (ref 3–12)
Neutro Abs: 6 10*3/uL (ref 1.7–7.7)
Neutrophils Relative %: 71 % (ref 43–77)
RBC: 3.4 MIL/uL — ABNORMAL LOW (ref 3.87–5.11)
Rh Type: POSITIVE
Rubella: 4.4 IU/mL

## 2011-12-14 NOTE — Telephone Encounter (Signed)
Pt is asking about her ultrasound appt and when it is   pls leave a message

## 2011-12-14 NOTE — Telephone Encounter (Signed)
No order placed. Will consult with pcp.Loralee Pacas [place]

## 2011-12-15 ENCOUNTER — Other Ambulatory Visit: Payer: Self-pay | Admitting: Sports Medicine

## 2011-12-15 DIAGNOSIS — Z3480 Encounter for supervision of other normal pregnancy, unspecified trimester: Secondary | ICD-10-CM

## 2011-12-15 NOTE — Telephone Encounter (Signed)
Order placed

## 2011-12-16 ENCOUNTER — Other Ambulatory Visit: Payer: Self-pay | Admitting: Sports Medicine

## 2011-12-16 DIAGNOSIS — Z3480 Encounter for supervision of other normal pregnancy, unspecified trimester: Secondary | ICD-10-CM

## 2011-12-20 ENCOUNTER — Ambulatory Visit (HOSPITAL_COMMUNITY)
Admission: RE | Admit: 2011-12-20 | Discharge: 2011-12-20 | Disposition: A | Discharge status: Home / Self Care | Payer: Medicaid Other | Source: Ambulatory Visit | Attending: Family Medicine | Admitting: Family Medicine

## 2011-12-20 DIAGNOSIS — O358XX0 Maternal care for other (suspected) fetal abnormality and damage, not applicable or unspecified: Secondary | ICD-10-CM | POA: Insufficient documentation

## 2011-12-20 DIAGNOSIS — Z363 Encounter for antenatal screening for malformations: Secondary | ICD-10-CM | POA: Insufficient documentation

## 2011-12-20 DIAGNOSIS — Z1389 Encounter for screening for other disorder: Secondary | ICD-10-CM | POA: Insufficient documentation

## 2011-12-20 DIAGNOSIS — Z3480 Encounter for supervision of other normal pregnancy, unspecified trimester: Secondary | ICD-10-CM

## 2011-12-29 ENCOUNTER — Encounter: Payer: Self-pay | Admitting: Sports Medicine

## 2012-01-05 ENCOUNTER — Encounter: Payer: Self-pay | Admitting: Family Medicine

## 2012-01-05 NOTE — Progress Notes (Signed)
Note reviewed.  Agree with plan.  Issues noted: Late to care Rubella non immune. Smoker.

## 2012-01-21 ENCOUNTER — Ambulatory Visit (INDEPENDENT_AMBULATORY_CARE_PROVIDER_SITE_OTHER): Payer: Medicaid Other | Admitting: Sports Medicine

## 2012-01-21 VITALS — BP 112/73 | Temp 97.7°F | Wt 135.0 lb

## 2012-01-21 DIAGNOSIS — Z23 Encounter for immunization: Secondary | ICD-10-CM

## 2012-01-21 DIAGNOSIS — Z3480 Encounter for supervision of other normal pregnancy, unspecified trimester: Secondary | ICD-10-CM

## 2012-01-21 LAB — CBC
Hemoglobin: 12 g/dL (ref 12.0–15.0)
MCH: 33.1 pg (ref 26.0–34.0)
MCHC: 35 g/dL (ref 30.0–36.0)
RDW: 13.1 % (ref 11.5–15.5)

## 2012-01-21 LAB — GLUCOSE, CAPILLARY: Glucose-Capillary: 80 mg/dL (ref 70–99)

## 2012-01-21 NOTE — Progress Notes (Signed)
21 yo [id number] @ [redacted]w[redacted]d; had missed 2 week follow up appointment previously; late to prenatal care. Some nausea, and 1 episode of vomiting; Poor po intake No dysuria.   Bacturia on culture.  Will treat with Keflex Has quit smoking. Given small measurements will refer for repeat US.  Encouraged to increase PO intake given nausea and 1 hour ogtt =  80 Red flags reviewed Refer to [facility] for next appointment in 2 weeks 28 week labs collected today

## 2012-01-21 NOTE — Patient Instructions (Addendum)
It was good to see you.  You have a urinary tract infection. Please take your medicine 4X per day.  Good job on quitting smoking.  You have an Ultrasound scheduled.  Please make this appointment.   Please follow up in 2 weeks with OB clinic  I will see you following that

## 2012-01-22 ENCOUNTER — Other Ambulatory Visit: Payer: Self-pay | Admitting: Family Medicine

## 2012-01-22 LAB — RPR

## 2012-01-22 MED ORDER — CEPHALEXIN 500 MG PO CAPS: 500.0000 mg | ORAL_CAPSULE | Freq: Two times a day (BID) | ORAL | Status: DC

## 2012-01-23 LAB — CULTURE, OB URINE: Colony Count: 35000

## 2012-01-26 ENCOUNTER — Encounter: Payer: Self-pay | Admitting: Sports Medicine

## 2012-01-26 NOTE — Progress Notes (Signed)
[facility] form completed as well. - late to prenatal care PHQ-9 - Difficulty: Not Difficult 1 2 3 4 5 6 7 8 9  Total:  0 0 1 1 1  0 0 1 0 4

## 2012-01-27 ENCOUNTER — Ambulatory Visit (HOSPITAL_COMMUNITY): Payer: Medicaid Other

## 2012-02-01 ENCOUNTER — Ambulatory Visit (HOSPITAL_COMMUNITY): Admission: RE | Admit: 2012-02-01 | Payer: Medicaid Other | Source: Ambulatory Visit

## 2012-02-03 ENCOUNTER — Ambulatory Visit: Payer: Medicaid Other | Admitting: Family Medicine

## 2012-02-03 VITALS — BP 112/69 | Wt 144.0 lb

## 2012-02-03 DIAGNOSIS — Z3490 Encounter for supervision of normal pregnancy, unspecified, unspecified trimester: Secondary | ICD-10-CM

## 2012-02-03 DIAGNOSIS — O26849 Uterine size-date discrepancy, unspecified trimester: Secondary | ICD-10-CM

## 2012-02-03 MED ORDER — TETANUS-DIPHTH-ACELL PERTUSSIS 5-2.5-18.5 LF-MCG/0.5 IM SUSP: 0.5000 mL | Freq: Once | INTRAMUSCULAR | Status: DC

## 2012-02-03 NOTE — Progress Notes (Signed)
Kimberly Bradley is a 21 y.o. [phone] at [redacted]w[redacted]d for routine follow up.  She reports low pelvic pain that occurs when standing after walking.  Resolves on its own.  No meds for this.   See flow sheet for details.  A/P: Pregnancy at [redacted]w[redacted]d.  Doing well.   Pregnancy issues include  Rubella non immune - needs post partum vaccine. Size date discrepany - dated by 25 weeks sono.  Will repeat ultrasound to ensure adequate fetal growth.  Infant feeding choice bottle  Contraception choice BTL - very sure that she does not want any more children.  Discussed her age is risk factor for later regret - she feels very sure of decision.  Forms signed today.  Pt informed she can change her mind before the procedure. Infant circumcision desired not applicable  Tdapwas given today. GBS/GC/CZ testing was not performed today.  Preterm labor precautions reviewed. Safe sleep discussed. Kick counts reviewed. Follow up 2 weeks.

## 2012-02-03 NOTE — Progress Notes (Signed)
21 yo [id number] @ [redacted]w[redacted]d presents for routine prenatal visit. She admits to some nausea but no vomiting. C/o sharp lower abdominal pain that can come on at any time of the day, but particularly when she is getting up. The pain will last for about 10 minutes and go away on it's own. Nothing helps the pain and she has not tried taking any OTC medications for the pain. These pains occur about 3-4 times per day over the past week. She also complains of some nausea but no vomiting. She says that these pains do not feel like contractions. Denies any other concerns today. She is interested in getting a tubal ligation after delivery.  -Will schedule [name] to check dates/ consistent fundal height less than expected. -Discussed with the patient some of the risks of tubal ligations, particularly regret in someone her age. She is sure that she does not want any more children after this baby, no matter what. Pt signed paperwork for tubal ligation after delivery.

## 2012-02-03 NOTE — Patient Instructions (Addendum)
It was great to see you today. If you have 4 contractions in an hour and they don't go away, if your baby does not move at least 10 times in 2 hours, if your water breaks 9a big gush of fluid from your vagina or constant trickle of water from your vagina) or if you have bleeding, please go to [facility]. Please call us with any questions. You signed the consent form today to have your tubes tied.  You can change your mind if you decide not to have the procedure.

## 2012-02-04 ENCOUNTER — Encounter: Payer: Self-pay | Admitting: *Deleted

## 2012-02-07 NOTE — Telephone Encounter (Signed)
Opened in error

## 2012-02-08 ENCOUNTER — Ambulatory Visit (HOSPITAL_COMMUNITY): Payer: Medicaid Other | Attending: Family Medicine

## 2012-02-14 ENCOUNTER — Ambulatory Visit (INDEPENDENT_AMBULATORY_CARE_PROVIDER_SITE_OTHER): Payer: Medicaid Other | Admitting: Family Medicine

## 2012-02-14 VITALS — BP 102/65 | Temp 99.0°F | Wt 141.0 lb

## 2012-02-14 DIAGNOSIS — Z3480 Encounter for supervision of other normal pregnancy, unspecified trimester: Secondary | ICD-10-CM

## 2012-02-14 NOTE — Assessment & Plan Note (Signed)
Never had U/S, was told someone would call her, no one did. She was scheduled today before she left clinic for later this week.

## 2012-02-14 NOTE — Progress Notes (Signed)
Kimberly Bradley is a 21 y.o. [phone] at [redacted]w[redacted]d for routine follow up. Minimal low back pain.  No vaginal bleeding, discharge, loss of fluid.   No one called for her size/date discrepancy U/S.    A/P: Pregnancy at [redacted]w[redacted]d. Doing well.  Pregnancy issues include  Rubella non immune - needs post partum vaccine.  Size date discrepany - dated by 25 weeks sono. Will repeat ultrasound to ensure adequate fetal growth.  Infant feeding choice bottle  Contraception choice BTL - very sure that she does not want any more children.  Infant circumcision desired not applicable  Preterm labor precautions reviewed.  Safe sleep discussed.  Kick counts reviewed.  Follow up 2 weeks.

## 2012-02-14 NOTE — Patient Instructions (Addendum)
Come back and see [name] in 2 weeks.   We will schedule you for the ultrasound as well as your baby is measuring a little small.   Pregnancy - Third Trimester The third trimester of pregnancy (the last 3 months) is a period of the most rapid growth for you and your baby. The baby approaches a length of 20 inches and a weight of 6 to 10 pounds. The baby is adding on fat and getting ready for life outside your body. While inside, babies have periods of sleeping and waking, suck their thumbs, and hiccups. You can often feel small contractions of the uterus. This is false labor. It is also called Braxton-Hicks contractions. This is like a practice for labor. The usual problems in this stage of pregnancy include more difficulty breathing, swelling of the hands and feet from water retention, and having to urinate more often because of the uterus and baby pressing on your bladder.  PRENATAL EXAMS  Blood work may continue to be done during prenatal exams. These tests are done to check on your health and the probable health of your baby. Blood work is used to follow your blood levels (hemoglobin). Anemia (low hemoglobin) is common during pregnancy. Iron and vitamins are given to help prevent this. You may also continue to be checked for diabetes. Some of the past blood tests may be done again.  The size of the uterus is measured during each visit. This makes sure your baby is growing properly according to your pregnancy dates.  Your blood pressure is checked every prenatal visit. This is to make sure you are not getting toxemia.  Your urine is checked every prenatal visit for infection, diabetes and protein.  Your weight is checked at each visit. This is done to make sure gains are happening at the suggested rate and that you and your baby are growing normally.  Sometimes, an ultrasound is performed to confirm the position and the proper growth and development of the baby. This is a test done that bounces  harmless sound waves off the baby so your caregiver can more accurately determine due dates.  Discuss the type of pain medication and anesthesia you will have during your labor and delivery.  Discuss the possibility and anesthesia if a Cesarean Section might be necessary.  Inform your caregiver if there is any mental or physical violence at home. Sometimes, a specialized non-stress test, contraction stress test and biophysical profile are done to make sure the baby is not having a problem. Checking the amniotic fluid surrounding the baby is called an amniocentesis. The amniotic fluid is removed by sticking a needle into the belly (abdomen). This is sometimes done near the end of pregnancy if an early delivery is required. In this case, it is done to help make sure the baby's lungs are mature enough for the baby to live outside of the womb. If the lungs are not mature and it is unsafe to deliver the baby, an injection of cortisone medication is given to the mother 1 to 2 days before the delivery. This helps the baby's lungs mature and makes it safer to deliver the baby. CHANGES OCCURING IN THE THIRD TRIMESTER OF PREGNANCY Your body goes through many changes during pregnancy. They vary from person to person. Talk to your caregiver about changes you notice and are concerned about.  During the last trimester, you have probably had an increase in your appetite. It is normal to have cravings for certain foods. This varies  from person to person and pregnancy to pregnancy.  You may begin to get stretch marks on your hips, abdomen, and breasts. These are normal changes in the body during pregnancy. There are no exercises or medications to take which prevent this change.  Constipation may be treated with a stool softener or adding bulk to your diet. Drinking lots of fluids, fiber in vegetables, fruits, and whole grains are helpful.  Exercising is also helpful. If you have been very active up until your  pregnancy, most of these activities can be continued during your pregnancy. If you have been less active, it is helpful to start an exercise program such as walking. Consult your caregiver before starting exercise programs.  Avoid all smoking, alcohol, un-prescribed drugs, herbs and "street drugs" during your pregnancy. These chemicals affect the formation and growth of the baby. Avoid chemicals throughout the pregnancy to ensure the delivery of a healthy infant.  Backache, varicose veins and hemorrhoids may develop or get worse.  You will tire more easily in the third trimester, which is normal.  The baby's movements may be stronger and more often.  You may become short of breath easily.  Your belly button may stick out.  A yellow discharge may leak from your breasts called colostrum.  You may have a bloody mucus discharge. This usually occurs a few days to a week before labor begins. HOME CARE INSTRUCTIONS   Keep your caregiver's appointments. Follow your caregiver's instructions regarding medication use, exercise, and diet.  During pregnancy, you are providing food for you and your baby. Continue to eat regular, well-balanced meals. Choose foods such as meat, fish, milk and other low fat dairy products, vegetables, fruits, and whole-grain breads and cereals. Your caregiver will tell you of the ideal weight gain.  A physical sexual relationship may be continued throughout pregnancy if there are no other problems such as early (premature) leaking of amniotic fluid from the membranes, vaginal bleeding, or belly (abdominal) pain.  Exercise regularly if there are no restrictions. Check with your caregiver if you are unsure of the safety of your exercises. Greater weight gain will occur in the last 2 trimesters of pregnancy. Exercising helps:  Control your weight.  Get you in shape for labor and delivery.  You lose weight after you deliver.  Rest a lot with legs elevated, or as needed  for leg cramps or low back pain.  Wear a good support or jogging bra for breast tenderness during pregnancy. This may help if worn during sleep. Pads or tissues may be used in the bra if you are leaking colostrum.  Do not use hot tubs, steam rooms, or saunas.  Wear your seat belt when driving. This protects you and your baby if you are in an accident.  Avoid raw meat, cat litter boxes and soil used by cats. These carry germs that can cause birth defects in the baby.  It is easier to loose urine during pregnancy. Tightening up and strengthening the pelvic muscles will help with this problem. You can practice stopping your urination while you are going to the bathroom. These are the same muscles you need to strengthen. It is also the muscles you would use if you were trying to stop from passing gas. You can practice tightening these muscles up 10 times a set and repeating this about 3 times per day. Once you know what muscles to tighten up, do not perform these exercises during urination. It is more likely to cause an infection  by backing up the urine.  Ask for help if you have financial, counseling or nutritional needs during pregnancy. Your caregiver will be able to offer counseling for these needs as well as refer you for other special needs.  Make a list of emergency phone numbers and have them available.  Plan on getting help from family or friends when you go home from the hospital.  Make a trial run to the hospital.  Take prenatal classes with the father to understand, practice and ask questions about the labor and delivery.  Prepare the baby's room/nursery.  Do not travel out of the city unless it is absolutely necessary and with the advice of your caregiver.  Wear only low or no heal shoes to have better balance and prevent falling. MEDICATIONS AND DRUG USE IN PREGNANCY  Take prenatal vitamins as directed. The vitamin should contain 1 milligram of folic acid. Keep all vitamins out  of reach of children. Only a couple vitamins or tablets containing iron may be fatal to a baby or young child when ingested.  Avoid use of all medications, including herbs, over-the-counter medications, not prescribed or suggested by your caregiver. Only take over-the-counter or prescription medicines for pain, discomfort, or fever as directed by your caregiver. Do not use aspirin, ibuprofen (Motrin, Advil, Nuprin) or naproxen (Aleve) unless OK'd by your caregiver.  Let your caregiver also know about herbs you may be using.  Alcohol is related to a number of birth defects. This includes fetal alcohol syndrome. All alcohol, in any form, should be avoided completely. Smoking will cause low birth rate and premature babies.  Street/illegal drugs are very harmful to the baby. They are absolutely forbidden. A baby born to an addicted mother will be addicted at birth. The baby will go through the same withdrawal an adult does. SEEK MEDICAL CARE IF: You have any concerns or worries during your pregnancy. It is better to call with your questions if you feel they cannot wait, rather than worry about them. DECISIONS ABOUT CIRCUMCISION You may or may not know the sex of your baby. If you know your baby is a boy, it may be time to think about circumcision. Circumcision is the removal of the foreskin of the penis. This is the skin that covers the sensitive end of the penis. There is no proven medical need for this. Often this decision is made on what is popular at the time or based upon religious beliefs and social issues. You can discuss these issues with your caregiver or pediatrician. SEEK IMMEDIATE MEDICAL CARE IF:   An unexplained oral temperature above 102 F (38.9 C) develops, or as your caregiver suggests.  You have leaking of fluid from the vagina (birth canal). If leaking membranes are suspected, take your temperature and tell your caregiver of this when you call.  There is vaginal spotting,  bleeding or passing clots. Tell your caregiver of the amount and how many pads are used.  You develop a bad smelling vaginal discharge with a change in the color from clear to white.  You develop vomiting that lasts more than 24 hours.  You develop chills or fever.  You develop shortness of breath.  You develop burning on urination.  You loose more than 2 pounds of weight or gain more than 2 pounds of weight or as suggested by your caregiver.  You notice sudden swelling of your face, hands, and feet or legs.  You develop belly (abdominal) pain. Round ligament discomfort is a common  non-cancerous (benign) cause of abdominal pain in pregnancy. Your caregiver still must evaluate you.  You develop a severe headache that does not go away.  You develop visual problems, blurred or double vision.  If you have not felt your baby move for more than 1 hour. If you think the baby is not moving as much as usual, eat something with sugar in it and lie down on your left side for an hour. The baby should move at least 4 to 5 times per hour. Call right away if your baby moves less than that.  You fall, are in a car accident or any kind of trauma.  There is mental or physical violence at home. Document Released: 03/09/2001 Document Revised: 06/07/2011 Document Reviewed: 09/11/2008 [facility] Patient Information 2013 [place], [place].

## 2012-02-16 ENCOUNTER — Ambulatory Visit (HOSPITAL_COMMUNITY)
Admission: RE | Admit: 2012-02-16 | Discharge: 2012-02-16 | Disposition: A | Discharge status: Home / Self Care | Payer: Medicaid Other | Source: Ambulatory Visit | Attending: Family Medicine | Admitting: Family Medicine

## 2012-02-16 DIAGNOSIS — O365990 Maternal care for other known or suspected poor fetal growth, unspecified trimester, not applicable or unspecified: Secondary | ICD-10-CM | POA: Insufficient documentation

## 2012-02-16 DIAGNOSIS — O26849 Uterine size-date discrepancy, unspecified trimester: Secondary | ICD-10-CM | POA: Insufficient documentation

## 2012-02-28 ENCOUNTER — Encounter: Payer: Medicaid Other | Admitting: Sports Medicine

## 2012-03-08 ENCOUNTER — Other Ambulatory Visit (HOSPITAL_COMMUNITY)
Admission: RE | Admit: 2012-03-08 | Discharge: 2012-03-08 | Disposition: A | Discharge status: Home / Self Care | Payer: Medicaid Other | Source: Ambulatory Visit | Attending: Family Medicine | Admitting: Family Medicine

## 2012-03-08 ENCOUNTER — Ambulatory Visit (INDEPENDENT_AMBULATORY_CARE_PROVIDER_SITE_OTHER): Payer: Medicaid Other | Admitting: Sports Medicine

## 2012-03-08 VITALS — BP 119/68 | Temp 98.7°F | Wt 150.5 lb

## 2012-03-08 DIAGNOSIS — Z113 Encounter for screening for infections with a predominantly sexual mode of transmission: Secondary | ICD-10-CM | POA: Insufficient documentation

## 2012-03-08 DIAGNOSIS — Z3480 Encounter for supervision of other normal pregnancy, unspecified trimester: Secondary | ICD-10-CM

## 2012-03-08 LAB — POCT WET PREP (WET MOUNT): Clue Cells Wet Prep Whiff POC: NEGATIVE

## 2012-03-08 NOTE — Progress Notes (Signed)
Kimberly Bradley is a 21 y.o. [phone] at [redacted]w[redacted]d for routine follow up.  Doing well.  No acute complaints.   GC Chlamydia, Wet Prep (for thick creamy discharge), and GBS collected today Confirms BTL desired and papers signed. Last US unremarkable but still size/date discrepancy F/u in 1 week. S/Sx labor reviewed.

## 2012-03-08 NOTE — Patient Instructions (Addendum)
Normal Labor and Delivery  Your caregiver must first be sure you are in labor. Signs of labor include:  · You may pass what is called "the mucus plug" before labor begins. This is a small amount of blood stained mucus.  · Regular uterine contractions.  · The time between contractions get closer together.  · The discomfort and pain gradually gets more intense.  · Pains are mostly located in the back.  · Pains get worse when walking.  · The cervix (the opening of the uterus becomes thinner (begins to efface) and opens up (dilates).  Once you are in labor and admitted into the hospital or care center, your caregiver will do the following:  · A complete physical examination.  · Check your vital signs (blood pressure, pulse, temperature and the fetal heart rate).  · Do a vaginal examination (using a sterile glove and lubricant) to determine:  · The position (presentation) of the baby (head [vertex] or buttock first).  · The level (station) of the baby's head in the birth canal.  · The effacement and dilatation of the cervix.  · You may have your pubic hair shaved and be given an enema depending on your caregiver and the circumstance.  · An electronic monitor is usually placed on your abdomen. The monitor follows the length and intensity of the contractions, as well as the baby's heart rate.  · Usually, your caregiver will insert an IV in your arm with a bottle of sugar water. This is done as a precaution so that medications can be given to you quickly during labor or delivery.  NORMAL LABOR AND DELIVERY IS DIVIDED UP INTO 3 STAGES:  First Stage  This is when regular contractions begin and the cervix begins to efface and dilate. This stage can last from 3 to 15 hours. The end of the first stage is when the cervix is 100% effaced and 10 centimeters dilated. Pain medications may be given by   · Injection (morphine, demerol, etc.)  · Regional anesthesia (spinal, caudal or epidural, anesthetics given in different locations of  the spine). Paracervical pain medication may be given, which is an injection of and anesthetic on each side of the cervix.  A pregnant woman may request to have "Natural Childbirth" which is not to have any medications or anesthesia during her labor and delivery.  Second Stage  This is when the baby comes down through the birth canal (vagina) and is born. This can take 1 to 4 hours. As the baby's head comes down through the birth canal, you may feel like you are going to have a bowel movement. You will get the urge to bear down and push until the baby is delivered. As the baby's head is being delivered, the caregiver will decide if an episiotomy (a cut in the perineum and vagina area) is needed to prevent tearing of the tissue in this area. The episiotomy is sewn up after the delivery of the baby and placenta. Sometimes a mask with nitrous oxide is given for the mother to breath during the delivery of the baby to help if there is too much pain. The end of Stage 2 is when the baby is fully delivered. Then when the umbilical cord stops pulsating it is clamped and cut.  Third Stage  The third stage begins after the baby is completely delivered and ends after the placenta (afterbirth) is delivered. This usually takes 5 to 30 minutes. After the placenta is delivered, a medication   is given either by intravenous or injection to help contract the uterus and prevent bleeding. The third stage is not painful and pain medication is usually not necessary. If an episiotomy was done, it is repaired at this time.  After the delivery, the mother is watched and monitored closely for 1 to 2 hours to make sure there is no postpartum bleeding (hemorrhage). If there is a lot of bleeding, medication is given to contract the uterus and stop the bleeding.  Document Released: 12/23/2007 Document Revised: 06/07/2011 Document Reviewed: 12/23/2007  [employer]® Patient Information ©2013 [employer].

## 2012-03-10 LAB — STREP B DNA PROBE: GBSP: NEGATIVE

## 2012-03-13 ENCOUNTER — Inpatient Hospital Stay (HOSPITAL_COMMUNITY): Payer: Medicaid Other | Admitting: Anesthesiology

## 2012-03-13 ENCOUNTER — Encounter (HOSPITAL_COMMUNITY): Payer: Self-pay | Admitting: *Deleted

## 2012-03-13 ENCOUNTER — Encounter (HOSPITAL_COMMUNITY): Payer: Self-pay | Admitting: Anesthesiology

## 2012-03-13 ENCOUNTER — Telehealth: Payer: Self-pay | Admitting: Sports Medicine

## 2012-03-13 ENCOUNTER — Inpatient Hospital Stay (HOSPITAL_COMMUNITY)
Admission: AD | Admit: 2012-03-13 | Discharge: 2012-03-15 | DRG: 775 | Disposition: A | Discharge status: Home / Self Care | Payer: Medicaid Other | Source: Ambulatory Visit | Attending: Obstetrics & Gynecology | Admitting: Obstetrics & Gynecology

## 2012-03-13 DIAGNOSIS — Z3480 Encounter for supervision of other normal pregnancy, unspecified trimester: Secondary | ICD-10-CM

## 2012-03-13 DIAGNOSIS — O4290 Premature rupture of membranes, unspecified as to length of time between rupture and onset of labor, unspecified weeks of gestation: Principal | ICD-10-CM | POA: Diagnosis present

## 2012-03-13 DIAGNOSIS — Z23 Encounter for immunization: Secondary | ICD-10-CM

## 2012-03-13 HISTORY — DX: Headache: R51

## 2012-03-13 LAB — CBC
HCT: 38 % (ref 36.0–46.0)
Hemoglobin: 12.7 g/dL (ref 12.0–15.0)
MCHC: 33.4 g/dL (ref 30.0–36.0)
RDW: 12.8 % (ref 11.5–15.5)
WBC: 11.3 10*3/uL — ABNORMAL HIGH (ref 4.0–10.5)

## 2012-03-13 LAB — RPR: RPR Ser Ql: NONREACTIVE

## 2012-03-13 LAB — POCT FERN TEST: POCT Fern Test: POSITIVE

## 2012-03-13 MED ORDER — OXYTOCIN BOLUS FROM INFUSION: 500.0000 mL | INTRAVENOUS | Status: DC

## 2012-03-13 MED ORDER — OXYTOCIN 40 UNITS IN LACTATED RINGERS INFUSION - SIMPLE MED
1.0000 m[IU]/min | INTRAVENOUS | Status: DC
  Filled 2012-03-13: qty 1000

## 2012-03-13 MED ORDER — TERBUTALINE SULFATE 1 MG/ML IJ SOLN: 0.2500 mg | Freq: Once | INTRAMUSCULAR | Status: AC | PRN

## 2012-03-13 MED ORDER — LIDOCAINE HCL (PF) 1 % IJ SOLN
INTRAMUSCULAR | Status: DC | PRN
  Administered 2012-03-13 (×4): 4 mL

## 2012-03-13 MED ORDER — PHENYLEPHRINE 40 MCG/ML (10ML) SYRINGE FOR IV PUSH (FOR BLOOD PRESSURE SUPPORT): 80.0000 ug | PREFILLED_SYRINGE | INTRAVENOUS | Status: DC | PRN

## 2012-03-13 MED ORDER — SODIUM CHLORIDE 0.9 % IV SOLN
2.0000 g | Freq: Four times a day (QID) | INTRAVENOUS | Status: DC
  Administered 2012-03-13: 2 g via INTRAVENOUS
  Filled 2012-03-13 (×4): qty 2000

## 2012-03-13 MED ORDER — ONDANSETRON HCL 4 MG/2ML IJ SOLN: 4.0000 mg | Freq: Four times a day (QID) | INTRAMUSCULAR | Status: DC | PRN

## 2012-03-13 MED ORDER — LACTATED RINGERS IV SOLN
INTRAVENOUS | Status: DC
  Administered 2012-03-13 (×2): via INTRAVENOUS

## 2012-03-13 MED ORDER — EPHEDRINE 5 MG/ML INJ: 10.0000 mg | INTRAVENOUS | Status: DC | PRN

## 2012-03-13 MED ORDER — CITRIC ACID-SODIUM CITRATE 334-500 MG/5ML PO SOLN: 30.0000 mL | ORAL | Status: DC | PRN

## 2012-03-13 MED ORDER — LIDOCAINE HCL (PF) 1 % IJ SOLN
30.0000 mL | INTRAMUSCULAR | Status: DC | PRN
  Filled 2012-03-13: qty 30

## 2012-03-13 MED ORDER — GENTAMICIN SULFATE 40 MG/ML IJ SOLN
100.0000 mg | Freq: Three times a day (TID) | INTRAVENOUS | Status: DC
  Administered 2012-03-13: 100 mg via INTRAVENOUS
  Filled 2012-03-13 (×3): qty 2.5

## 2012-03-13 MED ORDER — EPHEDRINE 5 MG/ML INJ
10.0000 mg | INTRAVENOUS | Status: DC | PRN
  Filled 2012-03-13: qty 4

## 2012-03-13 MED ORDER — DIPHENHYDRAMINE HCL 50 MG/ML IJ SOLN: 12.5000 mg | INTRAMUSCULAR | Status: DC | PRN

## 2012-03-13 MED ORDER — LACTATED RINGERS IV SOLN: 500.0000 mL | INTRAVENOUS | Status: DC | PRN

## 2012-03-13 MED ORDER — LACTATED RINGERS IV SOLN
500.0000 mL | Freq: Once | INTRAVENOUS | Status: AC
  Administered 2012-03-13: 1000 mL via INTRAVENOUS

## 2012-03-13 MED ORDER — OXYTOCIN 40 UNITS IN LACTATED RINGERS INFUSION - SIMPLE MED
62.5000 mL/h | INTRAVENOUS | Status: DC
  Administered 2012-03-14: 62.5 mL/h via INTRAVENOUS
  Administered 2012-03-14: 999 mL/h via INTRAVENOUS

## 2012-03-13 MED ORDER — GENTAMICIN SULFATE 40 MG/ML IJ SOLN: 1.5000 mg/kg | Freq: Three times a day (TID) | INTRAVENOUS | Status: DC

## 2012-03-13 MED ORDER — FENTANYL 2.5 MCG/ML BUPIVACAINE 1/10 % EPIDURAL INFUSION (WH - ANES)
14.0000 mL/h | INTRAMUSCULAR | Status: DC
  Administered 2012-03-13: 14 mL/h via EPIDURAL
  Filled 2012-03-13: qty 125

## 2012-03-13 MED ORDER — ACETAMINOPHEN 325 MG PO TABS: 650.0000 mg | ORAL_TABLET | ORAL | Status: DC | PRN

## 2012-03-13 MED ORDER — PHENYLEPHRINE 40 MCG/ML (10ML) SYRINGE FOR IV PUSH (FOR BLOOD PRESSURE SUPPORT)
80.0000 ug | PREFILLED_SYRINGE | INTRAVENOUS | Status: DC | PRN
  Filled 2012-03-13: qty 5

## 2012-03-13 MED ORDER — IBUPROFEN 600 MG PO TABS: 600.0000 mg | ORAL_TABLET | Freq: Four times a day (QID) | ORAL | Status: DC | PRN

## 2012-03-13 MED ORDER — GENTAMICIN IN SALINE 1-0.9 MG/ML-% IV SOLN
100.0000 mg | Freq: Three times a day (TID) | INTRAVENOUS | Status: DC
  Filled 2012-03-13: qty 100

## 2012-03-13 NOTE — MAU Note (Signed)
Patient states she thinks she has been leaking since 12/13. UC's since 12/15, irregular.

## 2012-03-13 NOTE — MAU Provider Note (Signed)
[id number] at [redacted]w[redacted]d with hx leaking x 3days. Per RN: fern positive, cx/1/70/-2. GBS neg. FHR category 1 Dr. Berline Chough, [facility], called to notify of admission and need for induction of labor See admission for full H&P.

## 2012-03-13 NOTE — Anesthesia Preprocedure Evaluation (Signed)
Anesthesia Evaluation  Patient identified by MRN, date of birth, ID band Patient awake    Reviewed: Allergy & Precautions, H&P , NPO status , Patient's Chart, lab work & pertinent test results, reviewed documented beta blocker date and time   History of Anesthesia Complications Negative for: history of anesthetic complications  Airway Mallampati: I TM Distance: >3 FB Neck ROM: full    Dental  (+) Teeth Intact   Pulmonary neg pulmonary ROS,  breath sounds clear to auscultation        Cardiovascular negative cardio ROS  Rhythm:regular Rate:Normal     Neuro/Psych negative neurological ROS  negative psych ROS   GI/Hepatic negative GI ROS, Neg liver ROS,   Endo/Other  negative endocrine ROS  Renal/GU negative Renal ROS  negative genitourinary   Musculoskeletal   Abdominal   Peds  Hematology negative hematology ROS (+)   Anesthesia Other Findings   Reproductive/Obstetrics (+) Pregnancy                           Anesthesia Physical Anesthesia Plan  ASA: II  Anesthesia Plan: Epidural   Post-op Pain Management:    Induction:   Airway Management Planned:   Additional Equipment:   Intra-op Plan:   Post-operative Plan:   Informed Consent: I have reviewed the patients History and Physical, chart, labs and discussed the procedure including the risks, benefits and alternatives for the proposed anesthesia with the patient or authorized representative who has indicated his/her understanding and acceptance.     Plan Discussed with:   Anesthesia Plan Comments:         Anesthesia Quick Evaluation  

## 2012-03-13 NOTE — Telephone Encounter (Signed)
Called back and spoke through video relay.  Mother states  daughter has been leaking fluid  all weekend. EMS came out on Sat and everything was all right according to mother. Patient has continued leaking fluid and now is cramping bad.  Advised to go to [facility] now for evaluation.  She voices understanding.

## 2012-03-13 NOTE — Progress Notes (Signed)
Kimberly Bradley is a 21 y.o. [phone] at [redacted]w[redacted]d admitted for PROM x 68 hrs  Subjective: Pt not feeling contractions but states still leaking fluid.  Objective: BP 119/53  Pulse 72  Temp 98.4 F (36.9 C) (Oral)  Resp 18  Ht 5' 5.5" (1.664 m)  Wt 68.04 kg (150 lb)  BMI 24.58 kg/m2  LMP 06/09/2011  Breastfeeding? Unknown      FHT:  FHR: 150 bpm, variability: moderate,  accelerations:  Present,  decelerations:  Absent UC:   regular, every 2-4 minutes SVE:   Dilation: 1.5 Effacement (%): 70 Station: -3 Exam by:: [employer]: Lab Results  Component Value Date   WBC 11.3* 03/13/2012   HGB 12.7 03/13/2012   HCT 38.0 03/13/2012   MCV 96.2 03/13/2012   PLT 243 03/13/2012    Assessment / Plan: Induction of labor due to PROM,  Good contraction pattern  Labor: Pitocin just started. Attempted Foley Bulb, too posterior Preeclampsia:  n/a Fetal Wellbeing:  Category I Pain Control:  Labor support without medications I/D:  Will start Amp and Gent for prolonged ROM Anticipated MOD:  NSVD  Napoleon Form 03/13/2012, 9:25 PM

## 2012-03-13 NOTE — MAU Provider Note (Signed)
Attestation of Attending Supervision of Advanced Practitioner (CNM/NP): Evaluation and management procedures were performed by the Advanced Practitioner under my supervision and collaboration.  I have reviewed the Advanced Practitioner's note and chart, and I agree with the management and plan.  HARRAWAY-SMITH, Kamiah Fite 7:28 PM     

## 2012-03-13 NOTE — Anesthesia Procedure Notes (Addendum)
Epidural Patient location during procedure: OB Start time: 03/13/2012 11:40 PM  Staffing Performed by: anesthesiologist   Preanesthetic Checklist Completed: patient identified, site marked, surgical consent, pre-op evaluation, timeout performed, IV checked, risks and benefits discussed and monitors and equipment checked  Epidural Patient position: sitting Prep: site prepped and draped and DuraPrep Patient monitoring: continuous pulse ox and blood pressure Approach: midline Injection technique: LOR air  Needle:  Needle type: Tuohy  Needle gauge: 17 G Needle length: 9 cm and 9 Needle insertion depth: 5 cm cm Catheter type: closed end flexible Catheter size: 19 Gauge Catheter at skin depth: 10 cm Test dose: negative  Assessment Events: blood not aspirated, injection not painful, no injection resistance, negative IV test and no paresthesia  Additional Notes Discussed risk of headache, infection, bleeding, nerve injury and failed or incomplete block.  Patient voices understanding and wishes to proceed.  Epidural placed easily on first attempt.  Patient tolerated procedure well with no apparent complications.  Jasmine December, MDReason for block:procedure for pain

## 2012-03-13 NOTE — H&P (Signed)
Kimberly Bradley is a 21 y.o. female [id number] at [redacted]w[redacted]d (by 25w [name], see below and PTT/A&P) presenting with prolonged PROM. Pt states her water broke "around midnight, late Friday night or early Saturday morning (12/13-14). Denies fever/chills or abdominal pain since that time. States she was "supposed to come to the MAU over the weekend" but did not have transportation and waited until today; she called her PCP's office and was told to come to MAU for evaluation for rupture. Endorses rare contractions, mostly on 12/14. Good fetal movements.  Denies complaints otherwise. Specifically denies headache, change in vision, new/worse swelling, RUQ pain.  Prenatal care at [facility] by PCP Dr. Berline Chough. Essentially uncomplicated, though late to care around 25 weeks. Of note, pt is Rubella non-immune and smoked up until about 30 weeks.  Maternal Medical History:  Reason for admission: Reason for admission: rupture of membranes.  Contractions: Onset was more than 2 days ago.   Frequency: irregular.   Duration is approximately 1 minute.   Perceived severity is moderate.    Fetal activity: Perceived fetal activity is normal.   Last perceived fetal movement was within the past hour.    Prenatal complications: no prenatal complications Prenatal Complications - Diabetes: none.    OB History    Grav Para Term Preterm Abortions TAB SAB Ect Mult Living   4 2 2  1  1   2      Past Medical History  Diagnosis Date  . Headache    Past Surgical History  Procedure Date  . Hernia repair    Family History: family history is not on file. Social History:  reports that she quit smoking about 4 months ago. She has never used smokeless tobacco. She reports that she does not drink alcohol or use illicit drugs.   Prenatal Transfer Tool  Maternal Diabetes: No Genetic Screening: Declined (too late) Maternal Ultrasounds/Referrals: Abnormal:  Findings:   Other: [name] 9/23 showed size consistent with 25w (27w by LMP at that  point); repeat 11/20 with appropriate growth, consistent with 9/23's [name] dating Fetal Ultrasounds or other Referrals:  None Maternal Substance Abuse:  Yes:  Type: Smoker (quit ~30 weeks) Significant Maternal Medications:  None Significant Maternal Lab Results:  Lab values include: Group B Strep negative, Other: RUBELLA NON-IMMUNE Other Comments:  Late to care  ROS: See HPI  Dilation: 1 Effacement (%): 60 Station: -3 Exam by:: Hayes Blood pressure 119/53, pulse 72, temperature 98.4 F (36.9 C), temperature source Oral, resp. rate 18, height 5' 5.5" (1.664 m), weight 68.04 kg (150 lb), last menstrual period 06/09/2011, unknown if currently breastfeeding. Maternal Exam:  Uterine Assessment: Contraction strength is moderate.  Contraction duration is 20 seconds. Contraction frequency is irregular.   Introitus: Normal vulva. Normal vagina.  Ferning test: positive.  Amniotic fluid character: clear.  Pelvis: adequate for delivery.   Cervix: Cervix evaluated by digital exam.     Fetal Exam Fetal Monitor Review: Mode: ultrasound.   Baseline rate: 140.  Variability: moderate (6-25 bpm).   Pattern: accelerations present and no decelerations.    Fetal State Assessment: Category I - tracings are normal.     Physical Exam  Vitals reviewed. Constitutional: She is oriented to person, place, and time. She appears well-developed and well-nourished. No distress.  HENT:  Head: Normocephalic and atraumatic.  Eyes: Conjunctivae normal are normal. Pupils are equal, round, and reactive to light.  Neck: Normal range of motion. Neck supple.  Cardiovascular: Normal rate, regular rhythm and normal heart sounds.  No murmur heard. Respiratory: Effort normal and breath sounds normal. She has no wheezes.  GI: Soft. Bowel sounds are normal. She exhibits no distension. There is no tenderness.       Gravid  Genitourinary: Vagina normal. Pelvic exam was performed with patient prone.       Dilation:  1 Effacement (%): 60 Cervical Position: Posterior Station: -3 Presentation: Vertex Exam by:: Madilyn Fireman  Musculoskeletal: Normal range of motion. She exhibits no edema.  Neurological: She is alert and oriented to person, place, and time.  Skin: Skin is warm and dry. No erythema.  Psychiatric: She has a normal mood and affect. Her behavior is normal.    Prenatal labs: ABO, Rh: O/POS/-- (09/16 1109) Antibody: NEG (09/16 1109) Rubella: 4.4 (09/16 1109) (NON-IMMUNE) RPR: NON REAC (10/25 1417)  HBsAg: NEGATIVE (09/16 1109)  HIV: NON REACTIVE (10/25 1417)  GBS: NEGATIVE (12/11 1556)  1h Glucola at 30 weeks: 80  Assessment/Plan: 21 y.o. [id number] at [redacted]w[redacted]d -Prolonged PROM (rupture around 0000 12/13-14) but no s/s of infection since or currently  -admit to L&D, routine admit orders  -induction of labor with Pitocin  -no prophylactic abx at this time as pt is GBS negative and has no s/s of infection and FHR tracing not suggestive of distress -complications/details of pregnancy  -smoker s/p quitting at 30w  -size/date discrepancy (EDD by LMP is 03/15/12 but by [name] on 9/30 is 03/29/12; 9/30 and 11/20 US's consistent with each other) --> BEST EDD 1/1, BEST GA 37.5  -Rubella non-immune, to be vaccinated postpartum -Pt's PCP (Dr. Berline Chough, [facility]) is aware of admission and plans to perform delivery, if able  Above discussed with Dr. Thad Ranger.  Bronte Kropf 03/13/2012, 8:37 PM

## 2012-03-13 NOTE — Telephone Encounter (Signed)
Mom is calling wanting to speak to someone about her daughter.  The only information she would give is that they had to call 911.  She also wanted to know how long before she can expect a call back.

## 2012-03-14 ENCOUNTER — Encounter (HOSPITAL_COMMUNITY): Payer: Self-pay | Admitting: *Deleted

## 2012-03-14 DIAGNOSIS — Z23 Encounter for immunization: Secondary | ICD-10-CM

## 2012-03-14 DIAGNOSIS — O4290 Premature rupture of membranes, unspecified as to length of time between rupture and onset of labor, unspecified weeks of gestation: Secondary | ICD-10-CM

## 2012-03-14 LAB — CBC
Hemoglobin: 11.6 g/dL — ABNORMAL LOW (ref 12.0–15.0)
MCH: 32.8 pg (ref 26.0–34.0)
MCHC: 34 g/dL (ref 30.0–36.0)
MCV: 96.3 fL (ref 78.0–100.0)
RBC: 3.54 MIL/uL — ABNORMAL LOW (ref 3.87–5.11)

## 2012-03-14 MED ORDER — ONDANSETRON HCL 4 MG PO TABS: 4.0000 mg | ORAL_TABLET | ORAL | Status: DC | PRN

## 2012-03-14 MED ORDER — SENNOSIDES-DOCUSATE SODIUM 8.6-50 MG PO TABS
2.0000 | ORAL_TABLET | Freq: Every day | ORAL | Status: DC
  Administered 2012-03-14: 2 via ORAL

## 2012-03-14 MED ORDER — PRENATAL MULTIVITAMIN CH
1.0000 | ORAL_TABLET | Freq: Every day | ORAL | Status: DC
  Administered 2012-03-14 – 2012-03-15 (×2): 1 via ORAL
  Filled 2012-03-14 (×2): qty 1

## 2012-03-14 MED ORDER — OXYCODONE-ACETAMINOPHEN 5-325 MG PO TABS
1.0000 | ORAL_TABLET | ORAL | Status: DC | PRN
  Administered 2012-03-14: 1 via ORAL
  Filled 2012-03-14: qty 1

## 2012-03-14 MED ORDER — METOCLOPRAMIDE HCL 10 MG PO TABS: 10.0000 mg | ORAL_TABLET | Freq: Once | ORAL | Status: DC

## 2012-03-14 MED ORDER — BENZOCAINE-MENTHOL 20-0.5 % EX AERO: 1.0000 "application " | INHALATION_SPRAY | CUTANEOUS | Status: DC | PRN

## 2012-03-14 MED ORDER — DIPHENHYDRAMINE HCL 25 MG PO CAPS: 25.0000 mg | ORAL_CAPSULE | Freq: Four times a day (QID) | ORAL | Status: DC | PRN

## 2012-03-14 MED ORDER — ZOLPIDEM TARTRATE 5 MG PO TABS: 5.0000 mg | ORAL_TABLET | Freq: Every evening | ORAL | Status: DC | PRN

## 2012-03-14 MED ORDER — DIBUCAINE 1 % RE OINT: 1.0000 "application " | TOPICAL_OINTMENT | RECTAL | Status: DC | PRN

## 2012-03-14 MED ORDER — TETANUS-DIPHTH-ACELL PERTUSSIS 5-2.5-18.5 LF-MCG/0.5 IM SUSP
0.5000 mL | Freq: Once | INTRAMUSCULAR | Status: AC
  Administered 2012-03-14: 0.5 mL via INTRAMUSCULAR
  Filled 2012-03-14: qty 0.5

## 2012-03-14 MED ORDER — LACTATED RINGERS IV SOLN: INTRAVENOUS | Status: DC

## 2012-03-14 MED ORDER — LANOLIN HYDROUS EX OINT: TOPICAL_OINTMENT | CUTANEOUS | Status: DC | PRN

## 2012-03-14 MED ORDER — IBUPROFEN 600 MG PO TABS
600.0000 mg | ORAL_TABLET | Freq: Four times a day (QID) | ORAL | Status: DC
  Administered 2012-03-14 – 2012-03-15 (×6): 600 mg via ORAL
  Filled 2012-03-14 (×6): qty 1

## 2012-03-14 MED ORDER — FAMOTIDINE 20 MG PO TABS: 40.0000 mg | ORAL_TABLET | Freq: Once | ORAL | Status: DC

## 2012-03-14 MED ORDER — WITCH HAZEL-GLYCERIN EX PADS: 1.0000 "application " | MEDICATED_PAD | CUTANEOUS | Status: DC | PRN

## 2012-03-14 MED ORDER — MEASLES, MUMPS & RUBELLA VAC ~~LOC~~ INJ
0.5000 mL | INJECTION | Freq: Once | SUBCUTANEOUS | Status: AC
  Administered 2012-03-15: 0.5 mL via SUBCUTANEOUS
  Filled 2012-03-14 (×3): qty 0.5

## 2012-03-14 MED ORDER — SIMETHICONE 80 MG PO CHEW: 80.0000 mg | CHEWABLE_TABLET | ORAL | Status: DC | PRN

## 2012-03-14 MED ORDER — ONDANSETRON HCL 4 MG/2ML IJ SOLN: 4.0000 mg | INTRAMUSCULAR | Status: DC | PRN

## 2012-03-14 NOTE — Anesthesia Postprocedure Evaluation (Signed)
  Anesthesia Post-op Note  Patient: Kimberly Bradley  Procedure(s) Performed: * No procedures listed *  Patient Location: Mother/Baby  Anesthesia Type:Epidural  Level of Consciousness: awake  Airway and Oxygen Therapy: Patient Spontanous Breathing  Post-op Pain: none  Post-op Assessment: Patient's Cardiovascular Status Stable, Respiratory Function Stable, Patent Airway, No signs of Nausea or vomiting, Adequate PO intake, Pain level controlled, No headache, No backache, No residual numbness and No residual motor weakness  Post-op Vital Signs: Reviewed and stable  Complications: No apparent anesthesia complications

## 2012-03-14 NOTE — H&P (Signed)
I saw and examined patient and agree with above. Will start Amp and Gent due to prolonged ROM.  Napoleon Form, MD

## 2012-03-14 NOTE — Progress Notes (Signed)
Patient doing well this morning without any complaints. Discussed plans for bilateral tubal ligation today. Patient reports previous umbilical hernia repair in 2010. Informed patient that in order to avoid injury to surrounding organs, it is best to perform this procedure in 6 weeks. Patient is agreeable to this plan and will be scheduled to be seen in 4 weeks at [facility] hospital clinic. Will obtain operative records from that procedure.

## 2012-03-14 NOTE — Progress Notes (Signed)
UR completed 

## 2012-03-15 MED ORDER — IBUPROFEN 600 MG PO TABS: 600.0000 mg | ORAL_TABLET | Freq: Four times a day (QID) | ORAL | Status: DC

## 2012-03-15 NOTE — Discharge Summary (Signed)
Obstetric Discharge Summary Reason for Admission: onset of labor and rupture of membranes (prolonged >24h) Prenatal Procedures: none Intrapartum Procedures: spontaneous vaginal delivery and abx (ampicillin, gentamycin) for prolonged ROM Postpartum Procedures: Rubella vaccine Complications-Operative and Postpartum: none Hemoglobin  Date Value Range Status  03/14/2012 11.6* 12.0 - 15.0 g/dL Final     HCT  Date Value Range Status  03/14/2012 34.1* 36.0 - 46.0 % Final    Physical Exam:  General: alert, cooperative and no distress Lochia: appropriate Uterine Fundus: firm DVT Evaluation: No evidence of DVT seen on physical exam.  Discharge Diagnoses: Term Pregnancy-delivered and PROM x>24h hours  Discharge Information: Date: 03/15/2012 Activity: pelvic rest Diet: routine Medications: PNV and Ibuprofen Condition: stable Instructions: refer to practice specific booklet Discharge to: home Follow-up Information    Follow up with RIGBY, MICHAEL, DO. Call in 4 weeks.   Contact information:   [address] [place] [address] [phone]          Newborn Data: Live born female  Birth Weight: 5 lb 5.2 oz (2415 g) APGAR: 9, 9  Home with mother. Bottle feeding. Planning interval BTL for tubal ligation.  Street, Christopher 03/15/2012, 7:48 AM  I have seen and examined this patient and I agree with the above. Cam Hai 8:59 AM 03/15/2012

## 2012-03-16 ENCOUNTER — Encounter: Payer: Medicaid Other | Admitting: Family Medicine

## 2012-03-16 NOTE — Discharge Summary (Signed)
Attestation of Attending Supervision of Advanced Practitioner (CNM/NP): Evaluation and management procedures were performed by the Advanced Practitioner under my supervision and collaboration.  I have reviewed the Advanced Practitioner's note and chart, and I agree with the management and plan.  Kawika Bischoff 03/16/2012 9:51 AM

## 2012-04-12 ENCOUNTER — Encounter: Payer: Self-pay | Admitting: Obstetrics & Gynecology

## 2012-04-12 ENCOUNTER — Ambulatory Visit (INDEPENDENT_AMBULATORY_CARE_PROVIDER_SITE_OTHER): Payer: Medicaid Other | Admitting: Obstetrics & Gynecology

## 2012-04-12 VITALS — BP 113/66 | HR 80 | Temp 97.1°F | Ht 66.0 in | Wt 129.6 lb

## 2012-04-12 DIAGNOSIS — Z3009 Encounter for other general counseling and advice on contraception: Secondary | ICD-10-CM

## 2012-04-12 NOTE — Progress Notes (Signed)
  Subjective:    Patient ID: Kimberly Bradley, female    DOB: [birth year], 22 y.o.   MRN: [id number]  HPI Kimberly Bradley who presents with request for BTL.  Recently s/p SVD.  No complaints.    Review of Systems     Objective:   Physical Exam BP 113/66  Pulse 80  Temp 97.1 F (36.2 C) (Oral)  Ht 5\' 6"  (1.676 m)  Wt 129 lb 9.6 oz (58.786 kg)  BMI 20.92 kg/m2  Breastfeeding? Yes Exam deferred  Has PP exam scheduled with her primary care physician in 2 weeks      Assessment & Plan:  Patient desires surgical management with sterilization.  The risks of surgery were discussed in detail with the patient including but not limited to: bleeding which may require transfusion or reoperation; infection which may require prolonged hospitalization or re-hospitalization and antibiotic therapy; injury to bowel, bladder, ureters and major vessels or other surrounding organs; need for additional procedures including laparotomy; thromboembolic phenomenon, incisional problems and other postoperative or anesthesia complications.  Patient was told that the likelihood that her condition and symptoms will be treated effectively with this surgical management was very high; the postoperative expectations were also discussed in detail. The patient also understands the alternative treatment options which were discussed in full. All questions were answered.  She was told that she will be contacted by our surgical scheduler regarding the time and date of her surgery; routine preoperative instructions of having nothing to eat or drink after midnight on the day prior to surgery and also coming to the hospital 1 1/2 hours prior to her time of surgery were also emphasized.  She was told she may be called for a preoperative appointment about a week prior to surgery and will be given further preoperative instructions at that visit. Printed patient education handouts about the procedure were given to the patient to review at  home.  I reviewed with her the alternative to sterilization but, pt is adamant that she want to proceed.  Title XIX papers signed 02/03/12 and were seen in the chart today.

## 2012-04-12 NOTE — Patient Instructions (Addendum)
Laparoscopic Tubal Ligation Laparoscopic tubal ligation is a procedure that closes the fallopian tubes at a time other than right after childbirth. By closing the fallopian tubes, the eggs that are released from the ovaries cannot enter the uterus and sperm cannot reach the egg. Tubal ligation is also known as getting your "tubes tied." Tubal ligation is done so you will not be able to get pregnant or have a baby.  Although this procedure may be reversed, it should be considered permanent and irreversible. If you want to have future pregnancies, you should not have this procedure.  LET YOUR CAREGIVER KNOW ABOUT:  Allergies to food or medicine.  Medicines taken, including vitamins, herbs, eyedrops, over-the-counter medicines, and creams.  Use of steroids (by mouth or creams).  Previous problems with numbing medicines.  History of bleeding problems or blood clots.  Any recent colds or infections.  Previous surgery.  Other health problems, including diabetes and kidney problems.  Possibility of pregnancy, if this applies.  Any past pregnancies. RISKS AND COMPLICATIONS   Infection.  Bleeding.  Injury to surrounding organs.  Anesthetic side effects.  Failure of the procedure.  Ectopic pregnancy.  Future regret about having the procedure done. BEFORE THE PROCEDURE  Do not take aspirin or blood thinners a week before the procedure or as directed. This can cause bleeding.  Do not eat or drink anything 6 to 8 hours before the procedure. PROCEDURE   You may be given a medicine to help you relax (sedative) before the procedure. You will be given a medicine to make you sleep (general anesthetic) during the procedure.  A tube will be put down your throat to help your breath while under general anesthesia.  Two small cuts (incisions) are made in the lower abdominal area and near the belly button.  Your abdominal area will be inflated with a safe gas (carbon dioxide). This helps  give the surgeon room to operate, visualize, and helps the surgeon avoid other organs.  A thin, lighted tube (laparoscope) with a camera attached is inserted into your abdomen through one of the incisions near the belly button. Other small instruments are also inserted through the other abdominal incision.  The fallopian tubes are located and are either blocked with a ring, clip, or are burned (cauterized).  After the fallopian tubes are blocked, the gas is released from the abdomen.  The incisions will be closed with stitches (sutures), and a bandage may be placed over the incisions. AFTER THE PROCEDURE   You will rest in a recovery room for 1 4 hours until you are stable and doing well.  You will also have some mild abdominal discomfort for 3 7 days. You will be given pain medicine to ease any discomfort.  As long as there are no problems, you may be allowed to go home. Someone will need to drive you home and be with you for at least 24 hours once home.  You may have some mild discomfort in the throat. This is from the tube placed in your throat while you were sleeping.  You may experience discomfort in the shoulder area from some trapped air between the liver and diaphragm. This sensation is normal and will slowly go away on its own. Document Released: 06/21/2000 Document Revised: 09/14/2011 Document Reviewed: 06/26/2011 [employer] Patient Information 2013 [employer].  

## 2012-04-25 ENCOUNTER — Encounter (HOSPITAL_COMMUNITY): Payer: Self-pay | Admitting: Pharmacist

## 2012-04-26 ENCOUNTER — Encounter: Payer: Self-pay | Admitting: Sports Medicine

## 2012-04-26 ENCOUNTER — Other Ambulatory Visit (HOSPITAL_COMMUNITY)
Admission: RE | Admit: 2012-04-26 | Discharge: 2012-04-26 | Disposition: A | Discharge status: Home / Self Care | Payer: Medicaid Other | Source: Ambulatory Visit | Attending: Family Medicine | Admitting: Family Medicine

## 2012-04-26 ENCOUNTER — Ambulatory Visit (INDEPENDENT_AMBULATORY_CARE_PROVIDER_SITE_OTHER): Payer: Medicaid Other | Admitting: Sports Medicine

## 2012-04-26 VITALS — BP 117/69 | HR 83 | Ht 66.0 in | Wt 128.0 lb

## 2012-04-26 DIAGNOSIS — Z01419 Encounter for gynecological examination (general) (routine) without abnormal findings: Secondary | ICD-10-CM | POA: Insufficient documentation

## 2012-04-26 DIAGNOSIS — Z124 Encounter for screening for malignant neoplasm of cervix: Secondary | ICD-10-CM

## 2012-04-26 DIAGNOSIS — Z3480 Encounter for supervision of other normal pregnancy, unspecified trimester: Secondary | ICD-10-CM

## 2012-04-26 NOTE — Progress Notes (Signed)
  Subjective:     Kimberly Bradley is a 22 y.o. female who presents for a postpartum visit. She is 6 weeks postpartum following a spontaneous vaginal delivery. I have fully reviewed the prenatal and intrapartum course. The delivery was at [redacted]w[redacted]d gestational weeks. Outcome: spontaneous vaginal delivery. Anesthesia: epidural. Postpartum course has been uncomplicated. Baby's course has been uncomplicated. Baby is feeding by bottle - [employer] Good Start. Bleeding no bleeding. Bowel function is normal. Bladder function is normal. Patient is not sexually active. Contraception method is plan interval tubal due to prior abdominal surgery (hernia repair). Postpartum depression screening: negative.  The following portions of the patient's history were reviewed and updated as appropriate: allergies, current medications, past family history, past medical history, past social history, past surgical history and problem list.  Review of Systems Pertinent items are noted in HPI.   Objective:    BP 117/69  Pulse 83  Ht 5\' 6"  (1.676 m)  Wt 128 lb (58.06 kg)  BMI 20.66 kg/m2  General:  alert, cooperative, appears stated age and no distress   Breasts:    Lungs: clear to auscultation bilaterally  Heart:  regular rate and rhythm, S1, S2 normal, no murmur, click, rub or gallop  Abdomen: soft, non-tender; bowel sounds normal; no masses,  no organomegaly   Vulva:  normal  Vagina: normal vagina  Cervix:  no bleeding following Pap, no cervical motion tenderness, no lesions and nulliparous appearance  Corpus: normal size, contour, position, consistency, mobility, non-tender  Adnexa:  normal adnexa and no mass, fullness, tenderness  Rectal Exam:          PHQ-9 - Difficulty: not 1 2 3 4 5 6 7 8 9  Total:  0 0 0 1 0 0 0 0 0 1    Assessment:    normal postpartum exam. Pap smear not done at today's visit.   Plan:    1. Contraception: plans interval tubal ligation next week 2. CCNC Pregnancy home risk screening  completed 3. Follow up in: as needed or as needed.

## 2012-04-26 NOTE — Patient Instructions (Addendum)
  Please follow up as scheduled for your Tubal.  Postpartum Tubal Ligation A postpartum tubal ligation (PPTL) is a procedure that blocks the fallopian tubes right after childbirth or 1 2 days after childbirth. PPTL is done before the uterus returns to its normal location. The procedure is also called a minilaparotomy. By blocking the fallopian tubes, the eggs that are released from the ovaries cannot enter the uterus and sperm cannot reach the egg. A PPTL is done so you will not be able to get pregnant or have a baby.  Although this procedure may be reversed, it should be considered permanent and irreversible. If you want to have future pregnancies, you should not have this procedure. LET YOUR CAREGIVER KNOW ABOUT:  Allergies to food or medicine.  Medicines taken, including vitamins, herbs, eyedrops, over-the-counter medicines, and creams.  Use of steroids (by mouth or creams).  Previous problems with numbing medicines.  History of bleeding problems or blood clots.  Any recent colds or infections.  Previous surgery.  Other health problems, including diabetes and kidney problems. RISKS AND COMPLICATIONS  Infection.  Bleeding.  Injury to other organs.  Anesthetic side effects.  Failure of the procedure.  Ectopic pregnancy.  Future regret about having the procedure done. BEFORE THE PROCEDURE   You may need to sign certain permission forms with your insurance up to 30 days before your due date.  After delivering your baby, you cannot eat or drink anything if the procedure is performed the same day. If you are having the procedure a day after delivering, you may be able to eat and drink until midnight. Your caregiver will give you specific directions depending on your situation. PROCEDURE   If done 1 2 days after delivery:  You will be given a medicine to make you sleep (general anesthetic) during the procedure.  A tube will be put down your throat to help your breath while  under general anesthesia.  A small cut (incision) is made just beneath the belly button.  The fallopian tubes are brought up through the incision.  The fallopian tubes are then sealed, tied, or cut.  If done after a caesarean delivery:  Tubal ligation is done through the incision already made (after the baby is delivered).  Once the tubes are blocked, the incision is closed with stitches (sutures). A bandage will be placed over the incisions. AFTER THE PROCEDURE  You may have some pain or cramps in the abdominal area for the next 3 7 days.  You will be given pain medicine to ease any discomfort.  You may also feel sick to your stomach if you were given a general anesthetic.  You may have some mild discomfort in the throat. This is from the tube that may have been placed in your throat while you were sleeping.  You may feel tired and should rest the remainder of the day. Document Released: 03/15/2005 Document Revised: 09/14/2011 Document Reviewed: 06/26/2011 [facility] Patient Information 2013 [place], [place].

## 2012-04-27 ENCOUNTER — Encounter (HOSPITAL_COMMUNITY): Payer: Self-pay

## 2012-04-27 ENCOUNTER — Encounter (HOSPITAL_COMMUNITY)
Admission: RE | Admit: 2012-04-27 | Discharge: 2012-04-27 | Disposition: A | Discharge status: Home / Self Care | Payer: Medicaid Other | Source: Ambulatory Visit | Attending: Obstetrics & Gynecology | Admitting: Obstetrics & Gynecology

## 2012-04-27 ENCOUNTER — Encounter: Payer: Self-pay | Admitting: Sports Medicine

## 2012-04-27 LAB — CBC
HCT: 37.4 % (ref 36.0–46.0)
Hemoglobin: 12.1 g/dL (ref 12.0–15.0)
MCH: 30.1 pg (ref 26.0–34.0)
MCV: 93 fL (ref 78.0–100.0)
RBC: 4.02 MIL/uL (ref 3.87–5.11)
WBC: 5.7 10*3/uL (ref 4.0–10.5)

## 2012-04-27 NOTE — Patient Instructions (Addendum)
   Your procedure is scheduled on: Wednesday, Feb 5  Enter through the [employer] [facility] at: 830 am Pick up the phone at the desk and dial [phone] and inform us of your arrival.  Please call this number if you have any problems the morning of surgery: [phone]  Remember: Do not eat food after midnight: Tuesday Do not drink clear liquids after: Tuesday Take these medicines the morning of surgery with a SIP OF WATER:  none  Do not wear jewelry, make-up, or FINGER nail polish No metal in your hair or on your body. Do not wear lotions, powders, perfumes. You may wear deodorant.  Please use your CHG wash as directed prior to surgery.  Do not shave anywhere for at least 12 hours prior to first CHG shower.  Do not bring valuables to the hospital. Contacts, dentures or bridgework may not be worn into surgery.  Patients discharged on the day of surgery will not be allowed to drive home.  Ride home to be arranged prior to surgery.

## 2012-04-27 NOTE — Addendum Note (Signed)
Addended by: Damita Lack on: 04/27/2012 10:17 AM   Modules accepted: Orders

## 2012-05-03 ENCOUNTER — Encounter (HOSPITAL_COMMUNITY)
Admission: RE | Disposition: A | Discharge status: Home / Self Care | Payer: Self-pay | Source: Ambulatory Visit | Attending: Obstetrics & Gynecology

## 2012-05-03 ENCOUNTER — Encounter (HOSPITAL_COMMUNITY): Payer: Self-pay | Admitting: Obstetrics & Gynecology

## 2012-05-03 ENCOUNTER — Encounter (HOSPITAL_COMMUNITY): Payer: Self-pay | Admitting: Anesthesiology

## 2012-05-03 ENCOUNTER — Ambulatory Visit (HOSPITAL_COMMUNITY): Payer: Medicaid Other | Admitting: Anesthesiology

## 2012-05-03 ENCOUNTER — Ambulatory Visit (HOSPITAL_COMMUNITY)
Admission: RE | Admit: 2012-05-03 | Discharge: 2012-05-03 | Disposition: A | Discharge status: Home / Self Care | Payer: Medicaid Other | Source: Ambulatory Visit | Attending: Obstetrics & Gynecology | Admitting: Obstetrics & Gynecology

## 2012-05-03 DIAGNOSIS — Z302 Encounter for sterilization: Secondary | ICD-10-CM | POA: Insufficient documentation

## 2012-05-03 HISTORY — PX: LAPAROSCOPIC TUBAL LIGATION: SHX1937

## 2012-05-03 LAB — PREGNANCY, URINE: Preg Test, Ur: NEGATIVE

## 2012-05-03 SURGERY — LIGATION, FALLOPIAN TUBE, LAPAROSCOPIC
Anesthesia: General | Site: Abdomen | Wound class: Clean

## 2012-05-03 MED ORDER — GLYCOPYRROLATE 0.2 MG/ML IJ SOLN
INTRAMUSCULAR | Status: DC | PRN
  Administered 2012-05-03: 0.6 mg via INTRAVENOUS
  Administered 2012-05-03: 0.1 mg via INTRAVENOUS

## 2012-05-03 MED ORDER — FENTANYL CITRATE 0.05 MG/ML IJ SOLN
25.0000 ug | INTRAMUSCULAR | Status: DC | PRN
  Administered 2012-05-03 (×2): 50 ug via INTRAVENOUS

## 2012-05-03 MED ORDER — PROPOFOL 10 MG/ML IV BOLUS
INTRAVENOUS | Status: DC | PRN
  Administered 2012-05-03: 200 mg via INTRAVENOUS

## 2012-05-03 MED ORDER — GLYCOPYRROLATE 0.2 MG/ML IJ SOLN
INTRAMUSCULAR | Status: AC
  Filled 2012-05-03: qty 2

## 2012-05-03 MED ORDER — ONDANSETRON HCL 4 MG/2ML IJ SOLN
INTRAMUSCULAR | Status: AC
  Filled 2012-05-03: qty 2

## 2012-05-03 MED ORDER — MORPHINE SULFATE 4 MG/ML IJ SOLN: 1.0000 mg | INTRAMUSCULAR | Status: DC | PRN

## 2012-05-03 MED ORDER — LIDOCAINE HCL (CARDIAC) 20 MG/ML IV SOLN
INTRAVENOUS | Status: AC
  Filled 2012-05-03: qty 5

## 2012-05-03 MED ORDER — FENTANYL CITRATE 0.05 MG/ML IJ SOLN
INTRAMUSCULAR | Status: DC | PRN
  Administered 2012-05-03: 100 ug via INTRAVENOUS
  Administered 2012-05-03: 50 ug via INTRAVENOUS

## 2012-05-03 MED ORDER — LIDOCAINE HCL (CARDIAC) 20 MG/ML IV SOLN
INTRAVENOUS | Status: DC | PRN
  Administered 2012-05-03: 80 mg via INTRAVENOUS

## 2012-05-03 MED ORDER — ROCURONIUM BROMIDE 100 MG/10ML IV SOLN
INTRAVENOUS | Status: DC | PRN
  Administered 2012-05-03: 25 mg via INTRAVENOUS
  Administered 2012-05-03: 5 mg via INTRAVENOUS

## 2012-05-03 MED ORDER — NEOSTIGMINE METHYLSULFATE 1 MG/ML IJ SOLN
INTRAMUSCULAR | Status: DC | PRN
  Administered 2012-05-03: 3 mg via INTRAVENOUS

## 2012-05-03 MED ORDER — DEXAMETHASONE SODIUM PHOSPHATE 10 MG/ML IJ SOLN
INTRAMUSCULAR | Status: AC
  Filled 2012-05-03: qty 1

## 2012-05-03 MED ORDER — FENTANYL CITRATE 0.05 MG/ML IJ SOLN
INTRAMUSCULAR | Status: AC
  Administered 2012-05-03: 50 ug via INTRAVENOUS
  Filled 2012-05-03: qty 2

## 2012-05-03 MED ORDER — OXYCODONE-ACETAMINOPHEN 5-325 MG PO TABS: 1.0000 | ORAL_TABLET | Freq: Four times a day (QID) | ORAL | Status: DC | PRN

## 2012-05-03 MED ORDER — LACTATED RINGERS IV SOLN: INTRAVENOUS | Status: DC

## 2012-05-03 MED ORDER — OXYCODONE-ACETAMINOPHEN 5-325 MG PO TABS
1.0000 | ORAL_TABLET | ORAL | Status: DC | PRN
  Administered 2012-05-03: 1 via ORAL

## 2012-05-03 MED ORDER — NEOSTIGMINE METHYLSULFATE 1 MG/ML IJ SOLN
INTRAMUSCULAR | Status: AC
  Filled 2012-05-03: qty 1

## 2012-05-03 MED ORDER — BUPIVACAINE HCL (PF) 0.25 % IJ SOLN
INTRAMUSCULAR | Status: AC
  Filled 2012-05-03: qty 30

## 2012-05-03 MED ORDER — FENTANYL CITRATE 0.05 MG/ML IJ SOLN
INTRAMUSCULAR | Status: AC
  Filled 2012-05-03: qty 5

## 2012-05-03 MED ORDER — MIDAZOLAM HCL 2 MG/2ML IJ SOLN
INTRAMUSCULAR | Status: AC
  Filled 2012-05-03: qty 2

## 2012-05-03 MED ORDER — DEXAMETHASONE SODIUM PHOSPHATE 10 MG/ML IJ SOLN
INTRAMUSCULAR | Status: DC | PRN
  Administered 2012-05-03: 10 mg via INTRAVENOUS

## 2012-05-03 MED ORDER — MIDAZOLAM HCL 5 MG/5ML IJ SOLN
INTRAMUSCULAR | Status: DC | PRN
  Administered 2012-05-03: 2 mg via INTRAVENOUS

## 2012-05-03 MED ORDER — ROCURONIUM BROMIDE 50 MG/5ML IV SOLN
INTRAVENOUS | Status: AC
  Filled 2012-05-03: qty 1

## 2012-05-03 MED ORDER — OXYCODONE-ACETAMINOPHEN 5-325 MG PO TABS
ORAL_TABLET | ORAL | Status: AC
  Administered 2012-05-03: 1 via ORAL
  Filled 2012-05-03: qty 1

## 2012-05-03 MED ORDER — LACTATED RINGERS IV SOLN
INTRAVENOUS | Status: DC
Start: 2012-05-03 — End: 2012-05-03
  Administered 2012-05-03 (×2): via INTRAVENOUS

## 2012-05-03 MED ORDER — PROPOFOL 10 MG/ML IV EMUL
INTRAVENOUS | Status: AC
  Filled 2012-05-03: qty 20

## 2012-05-03 MED ORDER — BUPIVACAINE HCL (PF) 0.25 % IJ SOLN
INTRAMUSCULAR | Status: DC | PRN
  Administered 2012-05-03: 10 mL

## 2012-05-03 MED ORDER — ONDANSETRON HCL 4 MG/2ML IJ SOLN
INTRAMUSCULAR | Status: DC | PRN
  Administered 2012-05-03: 4 mg via INTRAVENOUS

## 2012-05-03 MED ORDER — GLYCOPYRROLATE 0.2 MG/ML IJ SOLN
INTRAMUSCULAR | Status: AC
  Filled 2012-05-03: qty 1

## 2012-05-03 SURGICAL SUPPLY — 25 items
APL SKNCLS STERI-STRIP NONHPOA (GAUZE/BANDAGES/DRESSINGS) ×1
BENZOIN TINCTURE PRP APPL 2/3 (GAUZE/BANDAGES/DRESSINGS) ×1 IMPLANT
BNDG ADH 5X3 H2O RPLNT NS (GAUZE/BANDAGES/DRESSINGS) ×1
BNDG COHESIVE 3X5 WHT NS (GAUZE/BANDAGES/DRESSINGS) ×2 IMPLANT
CATH ROBINSON RED A/P 16FR (CATHETERS) ×2 IMPLANT
CHLORAPREP W/TINT 26ML (MISCELLANEOUS) ×2 IMPLANT
CLIP FILSHIE TUBAL LIGA STRL (Clip) ×1 IMPLANT
CLOTH BEACON ORANGE TIMEOUT ST (SAFETY) ×2 IMPLANT
GLOVE BIO SURGEON STRL SZ7 (GLOVE) ×2 IMPLANT
GLOVE BIOGEL PI IND STRL 7.0 (GLOVE) ×2 IMPLANT
GLOVE BIOGEL PI INDICATOR 7.0 (GLOVE) ×2
GOWN STRL REIN XL XLG (GOWN DISPOSABLE) ×4 IMPLANT
MANIPULATOR UTERINE 4.5 ZUMI (MISCELLANEOUS) ×2 IMPLANT
NDL INSUFFLATION 14GA 120MM (NEEDLE) IMPLANT
NEEDLE INSUFFLATION 14GA 120MM (NEEDLE) ×2 IMPLANT
PACK LAPAROSCOPY BASIN (CUSTOM PROCEDURE TRAY) ×2 IMPLANT
STRIP CLOSURE SKIN 1/4X3 (GAUZE/BANDAGES/DRESSINGS) ×1 IMPLANT
SUT VIC AB 4-0 PS2 18 (SUTURE) ×2 IMPLANT
SUT VICRYL 0 UR6 27IN ABS (SUTURE) ×4 IMPLANT
TOWEL OR 17X24 6PK STRL BLUE (TOWEL DISPOSABLE) ×4 IMPLANT
TROCAR BALLN 12MMX100 BLUNT (TROCAR) IMPLANT
TROCAR XCEL DIL TIP R 11M (ENDOMECHANICALS) ×1 IMPLANT
TROCAR XCEL NON-BLD 5MMX100MML (ENDOMECHANICALS) IMPLANT
WARMER LAPAROSCOPE (MISCELLANEOUS) ×2 IMPLANT
WATER STERILE IRR 1000ML POUR (IV SOLUTION) ×2 IMPLANT

## 2012-05-03 NOTE — Op Note (Signed)
Kimberly Bradley PREOPERATIVE DIAGNOSIS:  Undesired fertility  POSTOPERATIVE DIAGNOSIS:  Undesired fertility  PROCEDURE:  Laparoscopic Bilateral Tubal Sterilization using Filshie Clips   SURGEON: Janat Tabbert L. Harraway-Smith, M.D.  ANESTHESIA:  General endotracheal  COMPLICATIONS:  None immediate.  ESTIMATED BLOOD LOSS:  Less than 20 ml.  FLUIDS: 1000 ml LR.  URINE OUTPUT: 30 ml of clear urine.  INDICATIONS: 21yo [id number]  with undesired fertility, desires permanent sterilization. Other reversible forms of contraception were discussed with patient; she declines all other modalities.  Risks of procedure discussed with patient including permanence of method, bleeding, infection, injury to surrounding organs and need for additional procedures including laparotomy, risk of regret.  Failure risk of 0.5-1% with increased risk of ectopic gestation if pregnancy occurs was also discussed with patient.      FINDINGS:  Normal uterus, tubes, and ovaries.  TECHNIQUE:  The patient was taken to the operating room where general anesthesia was obtained without difficulty.  She was then placed in the dorsal lithotomy position and prepared and draped in sterile fashion.  After an adequate timeout was performed, a bivalved speculum was then placed in the patient's vagina, and the anterior lip of cervix grasped with the single-tooth tenaculum.  The uterine manipulator was then advanced into the uterus.  The single toothed tenaculum and speculum was removed from the vagina.  Attention was then turned to the patient's abdomen where a 11-mm skin incision was made in the umbilical fold.  The Veress needle was carefully introduced into the peritoneal cavity through the abdominal wall.  Intraperitoneal placement was confirmed by drop in intraabdominal pressure with insufflation of carbon dioxide gas.  Adequate pneumoperitoneum was obtained, and the 11-mm trocar and sleeve were then advanced without difficulty into the  abdomen where intraabdominal placement was confirmed by the operative laparoscope. A survey of the patient's pelvis and abdomen revealed entirely normal anatomy.  The fallopian tubes were observed and found to be normal in appearance. The Filshie clip applicator was placed through the operative port, and a Filshie clip was placed on the right fallopian tube ,about 2 cm from the cornual attachment, with care given to incorporate the underlying mesosalpinx.  A similar process was carried out on the contralateral side allowing for bilateral tubal sterilization.   Good hemostasis was noted overall.  The instruments were then removed from the patient's abdomen and the fascial incision was repaired with 0 Vicryl, and the skin was closed with 3-0 vicryl.  Benzoin and steri-strips were applied.  The uterine manipulator was removed from the vagina without complications. The patient tolerated the procedure well.  Sponge, lap, and needle counts were correct times two.  The patient was then taken to the recovery room awake, extubated and in stable condition.

## 2012-05-03 NOTE — Anesthesia Preprocedure Evaluation (Signed)

## 2012-05-03 NOTE — H&P (Signed)
  Kimberly Bradley is a 22 y.o. female [phone] who presents for permanent sterilization with a laparoscopic BTL.  OB History    Grav  Para  Term  Preterm  Abortions  TAB  SAB  Ect  Mult  Living    4  3 3   1   1    3       Past Medical History   Diagnosis  Date   .  Headache     Past Surgical History   Procedure  Date   .  Hernia repair    Family History: family history is not on file.  Social History: reports that she quit smoking about 4 months ago. She has never used smokeless tobacco. She reports that she does not drink alcohol or use illicit drugs.  No current facility-administered medications on file prior to encounter.   No current outpatient prescriptions on file prior to encounter.    Physical Exam  Vitals reviewed. BP 108/66  Pulse 73  Temp 98.2 F (36.8 C) (Oral)  SpO2 98%  Breastfeeding? No  Constitutional: She is oriented to person, place, and time. She appears well-developed and well-nourished. No distress.  HENT:  Head: Normocephalic and atraumatic.  Eyes: Conjunctivae normal are normal. Pupils are equal, round, and reactive to light.  Neck: Normal range of motion. Neck supple.  Cardiovascular: Normal rate, regular rhythm and normal heart sounds.  No murmur heard.  Respiratory: Effort normal and breath sounds normal. She has no wheezes.  GI: Soft. Bowel sounds are normal. She exhibits no distension. There is no tenderness.  Musculoskeletal: Normal range of motion. She exhibits no edema.  Neurological: She is alert and oriented to person, place, and time.  Skin: Skin is warm and dry. No erythema.  Psychiatric: She has a normal mood and affect. Her behavior is normal.   Assessment/Plan:  22 y.o. [id number] for permanent sterilization.  Reviewed with patient the failure rate of 3-06/998 and the increased risk of ectopic pregnancy IF pregnancy occurs.  Reviewed common risks of procedure, including risk to bowel and bladder and blood vessels.

## 2012-05-03 NOTE — Transfer of Care (Signed)
Immediate Anesthesia Transfer of Care Note  Patient: Kimberly Bradley  Procedure(s) Performed: Procedure(s) (LRB) with comments: LAPAROSCOPIC TUBAL LIGATION (N/A)  Patient Location: PACU  Anesthesia Type:General  Level of Consciousness: awake, alert  and oriented  Airway & Oxygen Therapy: Patient Spontanous Breathing and Patient connected to nasal cannula oxygen  Post-op Assessment: Report given to PACU RN and Post -op Vital signs reviewed and stable  Post vital signs: Reviewed and stable  Complications: No apparent anesthesia complications

## 2012-05-03 NOTE — Anesthesia Procedure Notes (Signed)
Procedure Name: Intubation Date/Time: 05/03/2012 10:07 AM Performed by: Graciela Husbands Pre-anesthesia Checklist: Patient being monitored, Suction available, Emergency Drugs available, Patient identified and Timeout performed Patient Re-evaluated:Patient Re-evaluated prior to inductionOxygen Delivery Method: Circle system utilized Preoxygenation: Pre-oxygenation with 100% oxygen Intubation Type: IV induction Ventilation: Mask ventilation without difficulty Laryngoscope Size: Mac and 3 Grade View: Grade I Tube type: Oral Number of attempts: 1 Airway Equipment and Method: Patient positioned with wedge pillow Placement Confirmation: ETT inserted through vocal cords under direct vision,  positive ETCO2,  CO2 detector and breath sounds checked- equal and bilateral Secured at: 21 cm Tube secured with: Tape Dental Injury: Teeth and Oropharynx as per pre-operative assessment

## 2012-05-05 ENCOUNTER — Encounter (HOSPITAL_COMMUNITY): Payer: Self-pay | Admitting: Obstetrics & Gynecology

## 2012-05-06 ENCOUNTER — Telehealth: Payer: Self-pay | Admitting: Family Medicine

## 2012-05-06 NOTE — Telephone Encounter (Signed)
[facility] After hours line  Patient calling for narcotics after recent tubal ligation at [facility] hospital. Informed patient we do not refill narcotics over the phone. Additionally, surgeon would be appropriate contact ([facility]

## 2012-05-08 ENCOUNTER — Encounter: Payer: Self-pay | Admitting: *Deleted

## 2012-05-08 NOTE — Anesthesia Postprocedure Evaluation (Signed)
  Anesthesia Post-op Note  Patient: Kimberly Bradley  Procedure(s) Performed: Procedure(s): LAPAROSCOPIC TUBAL LIGATION (N/A) Patient is awake and responsive. Pain and nausea are reasonably well controlled. Vital signs are stable and clinically acceptable. Oxygen saturation is clinically acceptable. There are no apparent anesthetic complications at this time. Patient is ready for discharge.

## 2012-05-09 ENCOUNTER — Telehealth: Payer: Self-pay | Admitting: *Deleted

## 2012-05-09 NOTE — Telephone Encounter (Signed)
Patient phoned requesting a refill of oxycodone.  States she had BTL 05/03/12 and is still having pain in her stomach next to "belly button".  Rates her pain "5" on the pain scale.  States she is taking care of her three small children.  States she has tried tylenol, motrin and aleve and none of them work to relieve the pain.  Spoke with Dr. Erin Fulling and verbal order obtained for Ultram 50 mg PO every 6 hours as needed for pain.  I called patient to make her aware of the medication and that I was calling to her pharmacy - [facility].  Patient stated understanding.  When I went to place the order I was prompted with the patient's allergy.  I immediately cancelled the order and contacted Dr. Erin Fulling with the information and to obtain further orders.  Dr. Erin Fulling suggested the patient try Motrin or tylenol or come in to be seen.  Before I could call patient back, she called very upset.  When I spoke to patient she was very upset and stated the doctor "was pretty bad at her job if she prescribed something I'm allergic to."  I explained to patient that the miscommunication was my fault because I didn't look at her allergies and report those to Dr. Erin Fulling.  I explained that I had not actually put the order in because I was prompted with her allergies before I accepted the order.  I explained to patient that I had been in contact with Dr. Erin Fulling to find out what were her other options.  I explained that the doctor would like to see her.  Suggested she go to [facility] (our emergency room) here at [facility] or I could schedule an appointment for her with Dr. Erin Fulling tomorrow.  Patient stated she did not want to go to [facility] and have to wait and preferred an appointment.  I scheduled her an appointment for 2:45 pm tomorrow with Dr. Erin Fulling.  I apologized again for the mix-up before ending the call.

## 2012-05-10 ENCOUNTER — Ambulatory Visit: Payer: Medicaid Other | Admitting: Obstetrics & Gynecology

## 2012-05-25 ENCOUNTER — Ambulatory Visit: Payer: Medicaid Other | Admitting: Obstetrics & Gynecology

## 2013-02-01 ENCOUNTER — Other Ambulatory Visit: Payer: Self-pay

## 2013-04-13 ENCOUNTER — Ambulatory Visit: Payer: Medicaid Other | Admitting: Family Medicine

## 2013-07-13 ENCOUNTER — Other Ambulatory Visit (HOSPITAL_COMMUNITY)
Admission: RE | Admit: 2013-07-13 | Discharge: 2013-07-13 | Disposition: A | Discharge status: Home / Self Care | Payer: Medicaid Other | Source: Ambulatory Visit | Attending: Family Medicine | Admitting: Family Medicine

## 2013-07-13 ENCOUNTER — Encounter: Payer: Self-pay | Admitting: Family Medicine

## 2013-07-13 ENCOUNTER — Ambulatory Visit (INDEPENDENT_AMBULATORY_CARE_PROVIDER_SITE_OTHER): Payer: Medicaid Other | Admitting: Family Medicine

## 2013-07-13 VITALS — BP 111/71 | HR 66 | Temp 98.1°F | Ht 67.0 in | Wt 110.0 lb

## 2013-07-13 DIAGNOSIS — Z113 Encounter for screening for infections with a predominantly sexual mode of transmission: Secondary | ICD-10-CM | POA: Insufficient documentation

## 2013-07-13 DIAGNOSIS — N926 Irregular menstruation, unspecified: Secondary | ICD-10-CM

## 2013-07-13 DIAGNOSIS — N760 Acute vaginitis: Secondary | ICD-10-CM

## 2013-07-13 DIAGNOSIS — R1032 Left lower quadrant pain: Secondary | ICD-10-CM | POA: Insufficient documentation

## 2013-07-13 LAB — POCT URINE PREGNANCY: Preg Test, Ur: NEGATIVE

## 2013-07-13 LAB — POCT WET PREP (WET MOUNT): CLUE CELLS WET PREP WHIFF POC: NEGATIVE

## 2013-07-13 NOTE — Patient Instructions (Signed)
We will call you with results of your testing. If no abnormalities found or not a good explanation for your pain, we will consider pursuing an ultrasound. We should have all your results by Monday or Tuesday of next week.  Please make sure we get your urine for pregnancy test before you leave.   Thanks, Dr. Yong Channel

## 2013-07-13 NOTE — Assessment & Plan Note (Signed)
Urine pregnancy and wet prep negative. Patient mainly with mild tenderness in LLQ on exam. Denies discharge but had copious amount on exam. Has had unprotected sex and my leading diagnosis would be gonorrhea/chlamydia. Without CMT, will hold off on treatment until cultures. Would also consider transvaginal ultrasound to evaluate further if testing negative. If ultrasound negative and gc/chlamydia negative, would ask to follow up with PCP. No pain medicine requested.

## 2013-07-13 NOTE — Progress Notes (Signed)
  Garret Reddish, MD Phone: [phone]  Subjective:   Kimberly Bradley is a 23 y.o. year old very pleasant female patient who presents with the following:  Abdominal pain and nausea Location-left lower quadrant Quality- sharp Severity-6/10 Duration- stays for an hour then goes away for several hours then back for an hour. Occasionally wakes from sleep.  Timing- started 1 month ago  Context- not sure of what started it, inciting factors Modifying factors- not taking any medication for it, just rests and hopes it passes which it always does   ROS-Associated symptoms- intermittent nausea x 3 weeks not always with abdominal pain, occasionally lightheaded when gets abdominal pain, mild chronic low back pain, no fevers, occasional chills, no vomiting. Regular bowel movements. No vaginal discharge. No unintentional weight loss. No blood in bowel movements. No melena. LMP 07/01/13 and normal period without heavy amounts of discharge or worsening of abdominal pain. No dysuria or polyuria  Past Medical History- history of BTL 05/03/12, history of abdominal cramping with periods, history umbilical hernia s/p repair 2010, history abnormal pap smear, history right ovarian cyst, former smoker Social history- sexually active in last year including unprotected sex (men only).  Medications- at present, takes no medications   Objective: BP 111/71  Pulse 66  Temp(Src) 98.1 F (36.7 C) (Oral)  Ht 5\' 7"  (1.702 m)  Wt 110 lb (49.896 kg)  BMI 17.22 kg/m2  LMP 07/01/2013  Breastfeeding? No Gen: NAD, resting comfortably in chair  CV: RRR no murmurs rubs or gallops Lungs: CTAB no crackles, wheeze, rhonchi Abdomen: soft//nondistended/normal bowel sounds. No rebound or guarding. Does have very mild pain in LUQ and RLQ and mild pain in LLQ.  Pelvic: cervix normal in appearance, external genitalia normal, no adnexal masses or tenderness, no cervical motion tenderness, positive findings: vaginal discharge:   copious, white, yellow and thick and uterus normal size, shape, and consistency Ext: no edema Skin: warm, dry, no rash Neuro: grossly normal, moves all extremities  Results for orders placed in visit on 07/13/13 (from the past 24 hour(s))  POCT WET PREP (WET [place])     Status: None   Collection Time    07/13/13 12:10 PM      Result Value Ref Range   Source Wet Prep POC VAG     WBC, Wet Prep HPF POC >20     Bacteria Wet Prep HPF POC 3+ RODS     Clue Cells Wet Prep HPF POC None     CLUE CELLS WET PREP WHIFF POC Negative Whiff     Yeast Wet Prep HPF POC None     Trichomonas Wet Prep HPF POC None    POCT URINE PREGNANCY     Status: None   Collection Time    07/13/13 12:10 PM      Result Value Ref Range   Preg Test, Ur Negative      Assessment/Plan:  Abdominal pain, left lower quadrant Urine pregnancy and wet prep negative. Patient mainly with mild tenderness in LLQ on exam. Denies discharge but had copious amount on exam. Has had unprotected sex and my leading diagnosis would be gonorrhea/chlamydia. Without CMT, will hold off on treatment until cultures. Would also consider transvaginal ultrasound to evaluate further if testing negative. If ultrasound negative and gc/chlamydia negative, would ask to follow up with PCP. No pain medicine requested.    Orders Placed This Encounter  Procedures  . POCT Wet Prep [employer])  . POCT urine pregnancy

## 2013-07-16 LAB — CERVICOVAGINAL ANCILLARY ONLY
Chlamydia: NEGATIVE
Neisseria Gonorrhea: NEGATIVE

## 2013-07-17 ENCOUNTER — Telehealth: Payer: Self-pay | Admitting: Sports Medicine

## 2013-07-17 DIAGNOSIS — R1032 Left lower quadrant pain: Secondary | ICD-10-CM

## 2013-07-17 NOTE — Telephone Encounter (Signed)
Pt called and would like her test results. jw °

## 2013-07-17 NOTE — Telephone Encounter (Signed)
Please advise.Thank you.Dominigue Gellner S Kaliq Lege  

## 2013-07-17 NOTE — Telephone Encounter (Signed)
Will forward to provider who saw pt

## 2013-07-17 NOTE — Telephone Encounter (Signed)
Gc/chlamydia/wet prep/u preg negative. Will send for transvaginal ultrasound to better evaluate and will follow up with PCP if negative. Discussed plan with patient.   Red team-please schedule this exam and call patient. [facility] but [facility] would be fine as well.

## 2013-07-18 NOTE — Telephone Encounter (Signed)
LMVM for pt to call us regarding appt.   [place]

## 2013-07-18 NOTE — Telephone Encounter (Signed)
This is a white team patient....but I went ahead and scheduled patient for transvaginal ultrasound.  MD needs to add a pelvic ultrasound as well since that is their protocol.   Appt is 07/24/2013 at 1:45 pm.  Pt needs a full bladder. [place]

## 2013-07-18 NOTE — Telephone Encounter (Signed)
Kristen-I am sorry for forwarding to wrong team! My apologies.   I am forwarding to white team with additional order, I don't think anything needs to be added.

## 2013-07-19 NOTE — Telephone Encounter (Signed)
Pt notified of appt date, time and place.  [place]

## 2013-07-24 ENCOUNTER — Ambulatory Visit (HOSPITAL_COMMUNITY)
Admission: RE | Admit: 2013-07-24 | Discharge: 2013-07-24 | Disposition: A | Discharge status: Home / Self Care | Payer: Medicaid Other | Source: Ambulatory Visit | Attending: Family Medicine | Admitting: Family Medicine

## 2013-07-24 DIAGNOSIS — N854 Malposition of uterus: Secondary | ICD-10-CM | POA: Insufficient documentation

## 2013-07-24 DIAGNOSIS — R1032 Left lower quadrant pain: Secondary | ICD-10-CM | POA: Insufficient documentation

## 2014-01-28 ENCOUNTER — Encounter: Payer: Self-pay | Admitting: Family Medicine

## 2014-02-02 IMAGING — US US OB DETAIL+14 WK
2 series · 12 of 28 positions shown · non-contrast
Comparison: none

[Series 1: us ob detail +14 wk · 93 acquisitions, 10 frames shown (1 of 2)]
[im 5/93]
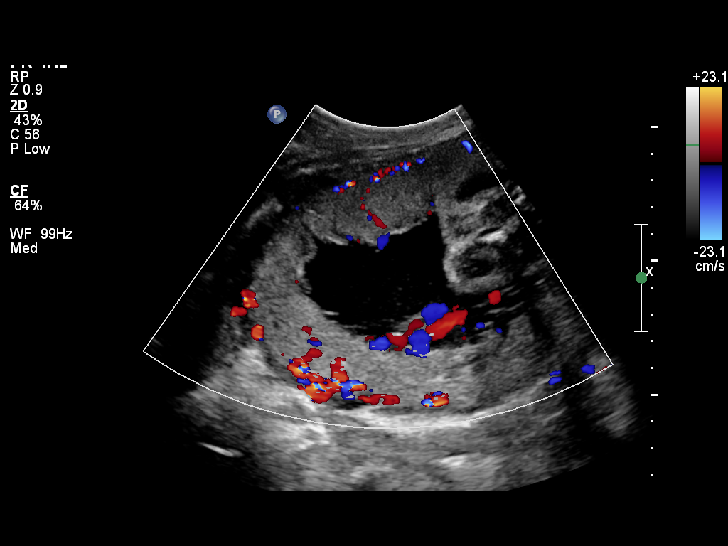
[im 13/93]
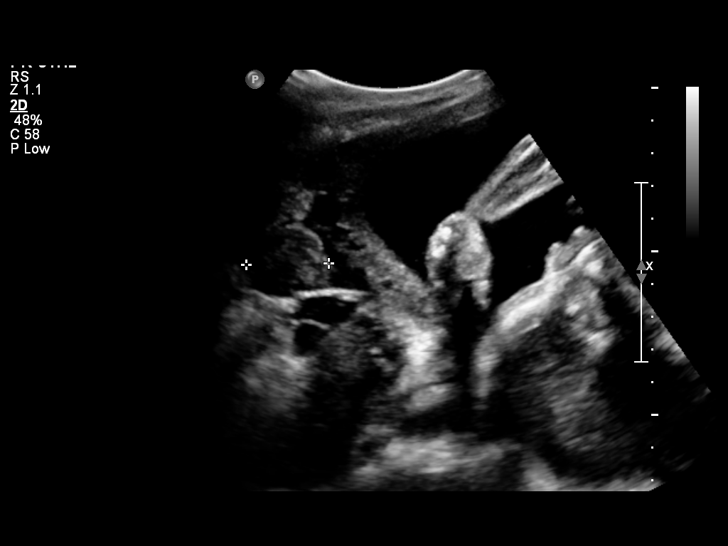
[im 21/93]
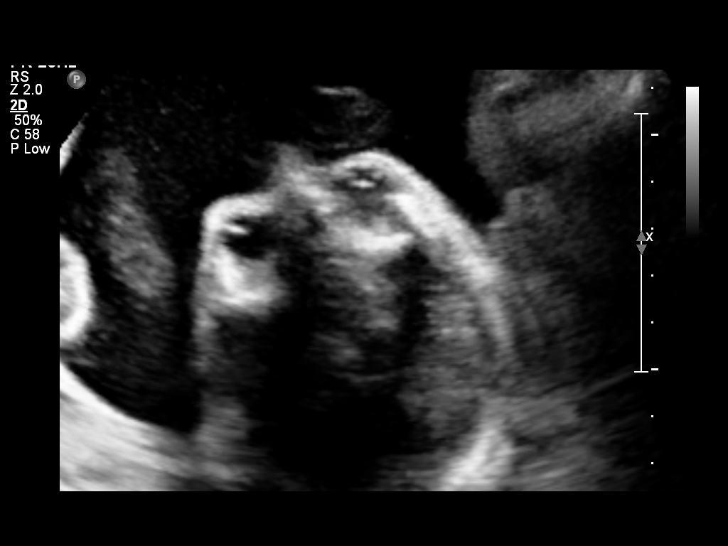
[im 33/93]
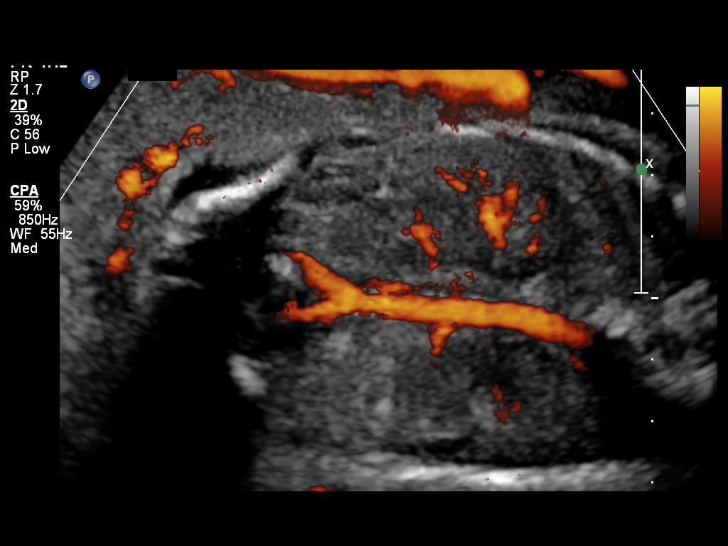
[im 41/93]
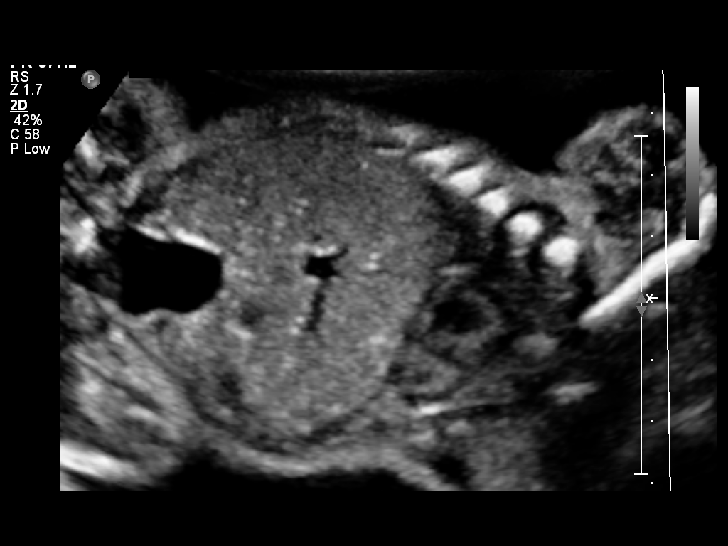
[im 49/93]
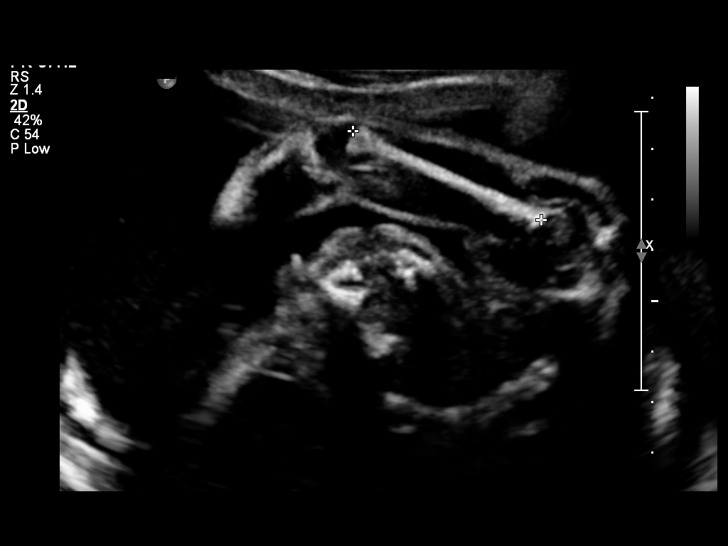
[im 61/93]
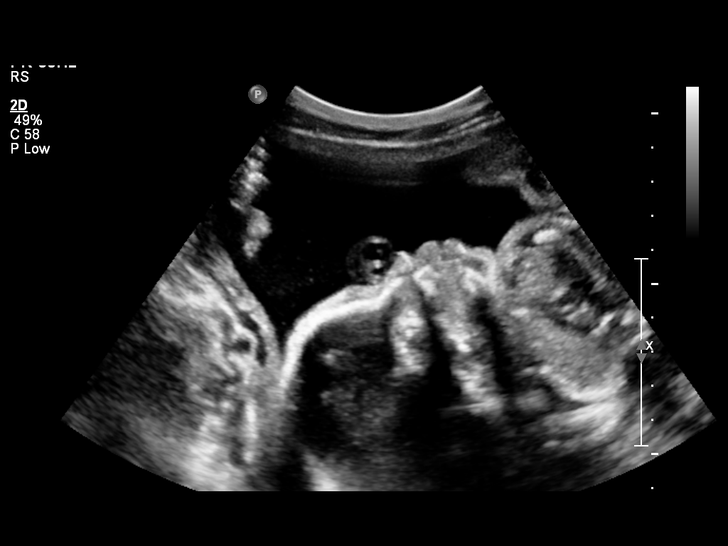
[im 69/93]
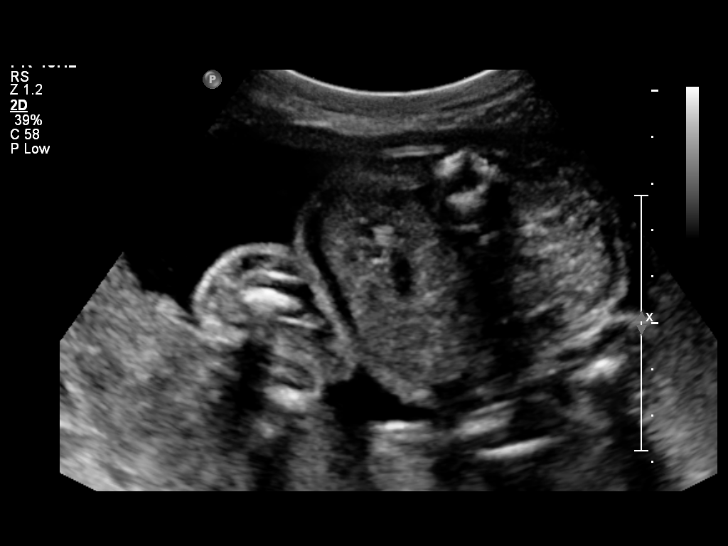
[im 77/93]
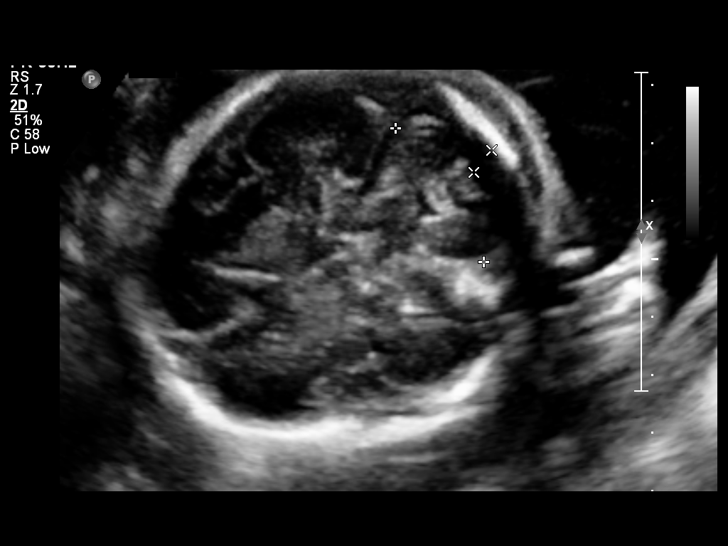
[im 89/93]
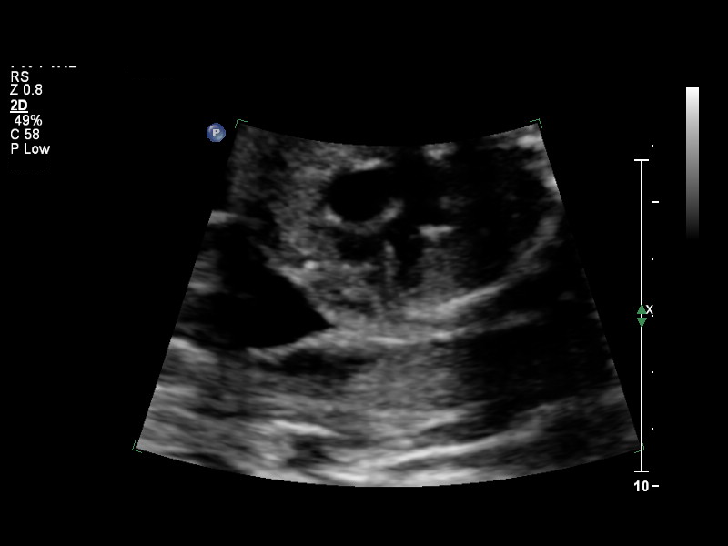

[Series 1: us ob detail +14 wk · 2 of 14 slices shown (2 of 2)]
[im 1/14]
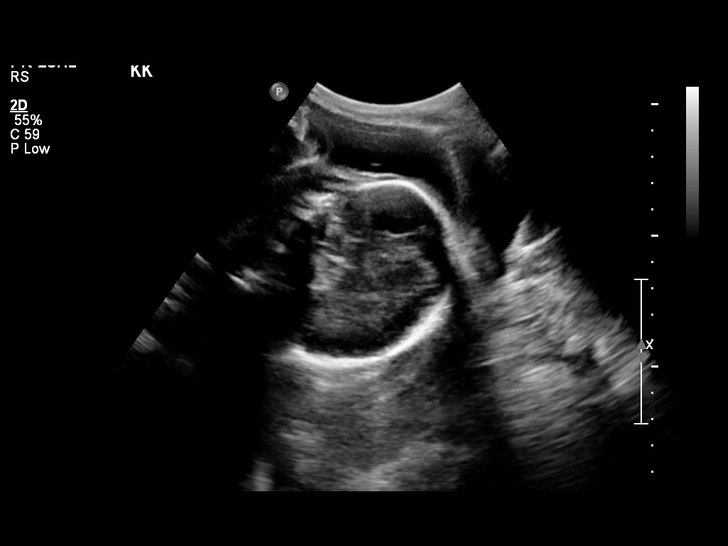
[im 9/14]
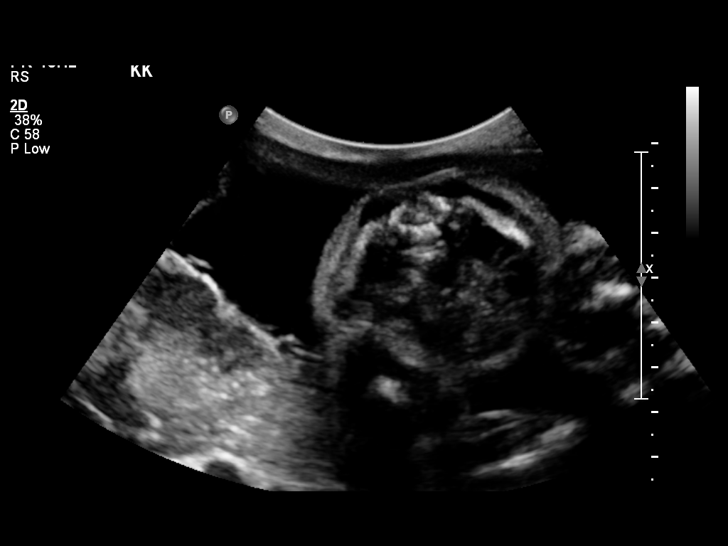

[12 of 28 positions shown; findings below may reference images not displayed]

OBSTETRICS REPORT
                      (Signed Final 12/20/2011 [DATE])

Service(s) Provided

 US OB DETAIL + 14 WK                                  76811.0
Indications

 Detailed fetal anatomic survey
 Unsure of LMP;  Establish Gestational [AGE]
Fetal Evaluation

 Num Of Fetuses:    1
 Fetal Heart Rate:  150                         bpm
 Cardiac Activity:  Observed
 Presentation:      Cephalic
 Placenta:          Fundal, above cervical os
 P. Cord            Visualized, central
 Insertion:

 Amniotic Fluid
 AFI FV:      Subjectively within normal limits
                                             Larg Pckt:     5.4  cm
Biometry

 BPD:     65.2  mm    G. Age:   26w 3d                CI:        75.36   70 - 86
                                                      FL/HC:      19.1   18.6 -

 HC:     238.2  mm    G. Age:   25w 6d       32  %    HC/AC:      1.14   1.04 -

 AC:     209.4  mm    G. Age:   25w 4d       35  %    FL/BPD:     69.6   71 - 87
 FL:      45.4  mm    G. Age:   25w 0d       19  %    FL/AC:      21.7   20 - 24
 HUM:     41.3  mm    G. Age:   25w 0d       26  %
 CER:     27.6  mm    G. Age:   24w 6d       33  %

 Est. FW:     806  gm    1 lb 12 oz      47  %
Gestational Age

 LMP:           27w 5d       Date:   06/09/11                 EDD:   03/15/12
 U/S Today:     25w 5d                                        EDD:   03/29/12
 Best:          25w 5d    Det. By:   U/S (12/20/11)           EDD:   03/29/12
Anatomy

 Cranium:          Appears normal         Aortic Arch:      Appears normal
 Fetal Cavum:      Appears normal         Ductal Arch:      Appears normal
 Ventricles:       Appears normal         Diaphragm:        Appears normal
 Choroid Plexus:   Appears normal         Stomach:          Appears normal, left
                                                            sided
 Cerebellum:       Appears normal         Abdomen:          Appears normal
 Posterior Fossa:  Appears normal         Abdominal Wall:   Appears nml (cord
                                                            insert, abd wall)
 Nuchal Fold:      Appears normal         Cord Vessels:     Appears normal (3
                                                            vessel cord)
 Face:             Appears normal         Kidneys:          Appear normal
                   (orbits and profile)
 Lips:             Appears normal         Bladder:          Appears normal
 Heart:            Appears normal         Spine:            Appears normal
                   (4CH, axis, and
                   situs)
 RVOT:             Not well visualized    Lower             Appears normal
                                          Extremities:
 LVOT:             Appears normal         Upper             Appears normal
                                          Extremities:

 Other:  Fetus appears to be a female. Nasal bone visualized. Heels and 5th
         digit visualized. Technically difficult due to fetal position.
Targeted Anatomy

 Fetal Central Nervous System
 Lat. Ventricles:  6.5                    Cisterna Magna:
Cervix Uterus Adnexa

 Cervical Length:   3.35      cm

 Cervix:       Normal appearance by transabdominal scan.
 Left Ovary:   Not visualized.
 Right Ovary:  Within normal limits.

 Adnexa:     No abnormality visualized.
Impression

 Single living IUP with estimated gestational age by ultrasound
 of 25w 5d, and EDD of 03/29/2012.
 No fetal anatomic abnormality is identified.
 Normal amniotic fluid volume and cervical length.

## 2014-04-01 ENCOUNTER — Ambulatory Visit: Payer: Medicaid Other | Admitting: Family Medicine

## 2014-05-09 ENCOUNTER — Encounter (HOSPITAL_COMMUNITY): Payer: Self-pay | Admitting: Emergency Medicine

## 2014-05-09 DIAGNOSIS — Z3202 Encounter for pregnancy test, result negative: Secondary | ICD-10-CM | POA: Insufficient documentation

## 2014-05-09 DIAGNOSIS — R51 Headache: Secondary | ICD-10-CM | POA: Diagnosis present

## 2014-05-09 DIAGNOSIS — G43001 Migraine without aura, not intractable, with status migrainosus: Secondary | ICD-10-CM | POA: Insufficient documentation

## 2014-05-09 DIAGNOSIS — Z87891 Personal history of nicotine dependence: Secondary | ICD-10-CM | POA: Insufficient documentation

## 2014-05-09 NOTE — ED Notes (Signed)
Pt. reports migraine headache onset this morning with emesis and photophobia , denies head injury / no fever or chills.

## 2014-05-10 ENCOUNTER — Emergency Department (HOSPITAL_COMMUNITY)
Admission: EM | Admit: 2014-05-10 | Discharge: 2014-05-10 | Disposition: A | Discharge status: Home / Self Care | Payer: Medicaid Other | Attending: Emergency Medicine | Admitting: Emergency Medicine

## 2014-05-10 ENCOUNTER — Encounter (HOSPITAL_COMMUNITY): Payer: Self-pay | Admitting: Emergency Medicine

## 2014-05-10 DIAGNOSIS — G43001 Migraine without aura, not intractable, with status migrainosus: Secondary | ICD-10-CM

## 2014-05-10 LAB — POC URINE PREG, ED: PREG TEST UR: NEGATIVE

## 2014-05-10 MED ORDER — METOCLOPRAMIDE HCL 5 MG/ML IJ SOLN
10.0000 mg | Freq: Once | INTRAMUSCULAR | Status: AC
Start: 2014-05-10 — End: 2014-05-10
  Administered 2014-05-10: 10 mg via INTRAVENOUS
  Filled 2014-05-10: qty 2

## 2014-05-10 MED ORDER — PROMETHAZINE HCL 25 MG/ML IJ SOLN
12.5000 mg | Freq: Once | INTRAMUSCULAR | Status: AC
  Administered 2014-05-10: 12.5 mg via INTRAVENOUS
  Filled 2014-05-10: qty 1

## 2014-05-10 MED ORDER — KETOROLAC TROMETHAMINE 30 MG/ML IJ SOLN
30.0000 mg | Freq: Once | INTRAMUSCULAR | Status: AC
  Administered 2014-05-10: 30 mg via INTRAVENOUS
  Filled 2014-05-10: qty 1

## 2014-05-10 MED ORDER — DIPHENHYDRAMINE HCL 50 MG/ML IJ SOLN
12.5000 mg | Freq: Once | INTRAMUSCULAR | Status: AC
  Administered 2014-05-10: 12.5 mg via INTRAVENOUS
  Filled 2014-05-10: qty 1

## 2014-05-10 MED ORDER — IBUPROFEN 800 MG PO TABS: 800.0000 mg | ORAL_TABLET | Freq: Three times a day (TID) | ORAL | Status: DC

## 2014-05-10 NOTE — Discharge Instructions (Signed)

## 2014-05-10 NOTE — ED Provider Notes (Signed)
CSN: [id number]     Arrival date & time 05/09/14  2311 History  This chart was scribed for Kimberly Atkins Alfonso Patten, MD by Hilda Lias, ED Scribe. This patient was seen in room B17C/B17C and the patient's care was started at 12:51 AM.    Chief Complaint  Patient presents with  . Migraine      Patient is a 24 y.o. female presenting with migraines. The history is provided by the patient. No language interpreter was used.  Migraine This is a recurrent problem. The current episode started 12 to 24 hours ago. The problem occurs constantly. The problem has not changed since onset.Associated symptoms include headaches. Pertinent negatives include no chest pain, no abdominal pain and no shortness of breath. Nothing aggravates the symptoms. Nothing relieves the symptoms. The treatment provided mild relief.     HPI Comments: Kimberly Bradley is a 24 y.o. female who presents to the Emergency Department complaining of a constant throbbing migraine headache that has been present since this morning with associated vomiting and photophobia. Pt states that she has recurrent headaches, but notes she has not had one this bad in over a month. Pt states she feels that being around her mother's dog triggered her headache this evening. Pt denies headaches being associated with menstrual cycle. Pt states she normally takes ibuprofen for HA and notes mild relief. Nothing atypical.  Similar to previous has weekly migraines last time this severe was a month ago.  No travel no focal neuro deficits no changes in vision or speech or cognition    Past Medical History  Diagnosis Date  . Headache(784.0)   . SVD (spontaneous vaginal delivery)     x 3   Past Surgical History  Procedure Laterality Date  . Hernia repair  2010  . Laparoscopic tubal ligation  05/03/2012    Procedure: LAPAROSCOPIC TUBAL LIGATION;  Surgeon: Lavonia Drafts, MD;  Location: [facility];  Service: Gynecology;  Laterality: N/A;   No family  history on file. History  Substance Use Topics  . Smoking status: Former Smoker -- 1.00 packs/day for 6 years    Types: Cigarettes    Quit date: 11/12/2011  . Smokeless tobacco: Never Used  . Alcohol Use: Yes     Comment: socially   OB History    Gravida Para Term Preterm AB TAB SAB Ectopic Multiple Living   4 3 3  1  1   3      Review of Systems  Eyes: Positive for photophobia. Negative for visual disturbance.  Respiratory: Negative for shortness of breath.   Cardiovascular: Negative for chest pain.  Gastrointestinal: Positive for vomiting. Negative for abdominal pain.  Musculoskeletal: Negative for myalgias, neck pain and neck stiffness.  Skin: Negative for pallor.  Neurological: Positive for headaches. Negative for dizziness, tremors, seizures, syncope, facial asymmetry, speech difficulty, weakness, light-headedness and numbness.  All other systems reviewed and are negative.     Allergies  Tramadol hcl and Codeine  Home Medications   Prior to Admission medications   Not on File   BP 110/55 mmHg  Pulse 93  Temp(Src) 98.1 F (36.7 C)  Resp 18  Ht 5\' 7"  (1.702 m)  Wt 112 lb (50.803 kg)  BMI 17.54 kg/m2  SpO2 100%  LMP 05/08/2014 Physical Exam  Constitutional: She is oriented to person, place, and time. She appears well-developed and well-nourished.  HENT:  Head: Normocephalic and atraumatic.  Mouth/Throat: Oropharynx is clear and moist. No oropharyngeal exudate.  No temporal tenderness  Eyes: Conjunctivae and EOM are normal. Pupils are equal, round, and reactive to light.  Neck: Normal range of motion. Neck supple.  Cardiovascular: Normal rate, regular rhythm and normal heart sounds.   Pulmonary/Chest: Effort normal and breath sounds normal. She has no wheezes. She has no rales.  Abdominal: Soft. Bowel sounds are normal. She exhibits no distension. There is no tenderness. There is no rebound and no guarding.  Musculoskeletal: Normal range of motion.  Cranial  nerves 2-12 intact   Lymphadenopathy:    She has no cervical adenopathy.  Neurological: She is alert and oriented to person, place, and time. She has normal reflexes. No cranial nerve deficit. Coordination normal.  Skin: Skin is warm and dry. No rash noted.  Psychiatric: She has a normal mood and affect.  Nursing note and vitals reviewed.   ED Course  Procedures (including critical care time)  DIAGNOSTIC STUDIES: Oxygen Saturation is 100% on RA, normal by my interpretation.    COORDINATION OF CARE: 12:55 AM Discussed treatment plan with pt at bedside and pt agreed to plan.   Labs Review Labs Reviewed  POC URINE PREG, ED    Imaging Review No results found.   EKG Interpretation None      MDM   Final diagnoses:  None   No changes in speech or cognition.  EOMI.  No neck pain or stiffness no neuro deficits.  No f/c/r no rashes no travel.  No indication for CT or LP.   Medications  ketorolac (TORADOL) 30 MG/ML injection 30 mg (30 mg Intravenous Given 05/10/14 0132)  promethazine (PHENERGAN) injection 12.5 mg (12.5 mg Intravenous Given 05/10/14 0121)  diphenhydrAMINE (BENADRYL) injection 12.5 mg (12.5 mg Intravenous Given 05/10/14 0118)  metoCLOPramide (REGLAN) injection 10 mg (10 mg Intravenous Given 05/10/14 0120)   Sleeping post medication.    Follow up with a headache specialist.  Ms. Garno evaluation in the ED is complete. It has been determined no life threatening conditions exist.  Follow up with headache speciliast  I personally performed the services described in this documentation, which was scribed in my presence. The recorded information has been reviewed and is accurate.     Carlisle Beers, MD 05/10/14 [phone]

## 2014-05-27 ENCOUNTER — Ambulatory Visit (INDEPENDENT_AMBULATORY_CARE_PROVIDER_SITE_OTHER): Payer: Medicaid Other | Admitting: *Deleted

## 2014-05-27 DIAGNOSIS — Z111 Encounter for screening for respiratory tuberculosis: Secondary | ICD-10-CM

## 2014-05-27 NOTE — Progress Notes (Signed)
   PPD placed Left Forearm.  Pt to return 05/29/2014 for reading.  Pt tolerated intradermal injection. Derl Barrow, RN

## 2014-05-29 ENCOUNTER — Encounter: Payer: Self-pay | Admitting: *Deleted

## 2014-05-29 ENCOUNTER — Ambulatory Visit: Payer: Medicaid Other | Admitting: *Deleted

## 2014-05-29 DIAGNOSIS — Z111 Encounter for screening for respiratory tuberculosis: Secondary | ICD-10-CM

## 2014-05-29 LAB — TB SKIN TEST
Induration: 0 mm
TB Skin Test: NEGATIVE

## 2014-05-29 NOTE — Progress Notes (Signed)
PPD Reading Note PPD read and results entered in [place]. Result: 0 mm induration. Interpretation: Negative If test not read within 48-72 hours of initial placement, patient advised to repeat in other arm 1-3 weeks after this test. Allergic reaction: no  Burna Forts, BSN, RN-BC

## 2015-04-03 ENCOUNTER — Encounter (HOSPITAL_COMMUNITY): Payer: Self-pay

## 2015-04-03 ENCOUNTER — Encounter: Payer: Self-pay | Admitting: Family Medicine

## 2015-04-03 ENCOUNTER — Emergency Department (HOSPITAL_COMMUNITY)
Admission: EM | Admit: 2015-04-03 | Discharge: 2015-04-03 | Discharge status: Against Medical Advice | Payer: Medicaid Other | Attending: Emergency Medicine | Admitting: Emergency Medicine

## 2015-04-03 ENCOUNTER — Ambulatory Visit (INDEPENDENT_AMBULATORY_CARE_PROVIDER_SITE_OTHER): Payer: Medicaid Other | Admitting: Family Medicine

## 2015-04-03 VITALS — BP 102/46 | HR 60 | Temp 98.3°F | Wt 110.0 lb

## 2015-04-03 DIAGNOSIS — R51 Headache: Secondary | ICD-10-CM | POA: Insufficient documentation

## 2015-04-03 DIAGNOSIS — R519 Headache, unspecified: Secondary | ICD-10-CM

## 2015-04-03 DIAGNOSIS — H538 Other visual disturbances: Secondary | ICD-10-CM | POA: Diagnosis not present

## 2015-04-03 DIAGNOSIS — R110 Nausea: Secondary | ICD-10-CM | POA: Diagnosis not present

## 2015-04-03 MED ORDER — OXYCODONE-ACETAMINOPHEN 5-325 MG PO TABS
1.0000 | ORAL_TABLET | Freq: Once | ORAL | Status: AC
  Administered 2015-04-03: 1 via ORAL

## 2015-04-03 MED ORDER — KETOROLAC TROMETHAMINE 60 MG/2ML IM SOLN
60.0000 mg | Freq: Once | INTRAMUSCULAR | Status: AC
  Administered 2015-04-03: 60 mg via INTRAMUSCULAR

## 2015-04-03 MED ORDER — OXYCODONE-ACETAMINOPHEN 5-325 MG PO TABS
ORAL_TABLET | ORAL | Status: AC
  Filled 2015-04-03: qty 1

## 2015-04-03 MED ORDER — CYCLOBENZAPRINE HCL 10 MG PO TABS: 10.0000 mg | ORAL_TABLET | Freq: Three times a day (TID) | ORAL | Status: DC | PRN

## 2015-04-03 NOTE — Patient Instructions (Signed)
It was a pleasure seeing you today in our clinic. Today we discussed your Headache. Here is the treatment plan we have discussed and agreed upon together:   - We provided you one dose of Toradol. This shot should help with some of the immediate pain. - I sent in a prescription for Flexeril. He may take this 2-3 times a day as needed. I believe that you may have some muscle spasms in her neck which may be the primary cause of this headache. This medication will help reduce these spasms. - You may take Tylenol for continued pain today. I would avoid any ibuprofen or Aleve for the rest of the day today due to its similarity to the Toradol will be provided. Starting tomorrow he may want to begin taking ibuprofen and Tylenol on a rotation as we discussed in the clinic today.  - Follow-up with [name] in one week if your headache persists or worsens.
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
## 2015-04-03 NOTE — ED Notes (Addendum)
Pt upset about wait time; attempted to explain delay, caregiver remains upset regarding time. Provided pt with update

## 2015-04-03 NOTE — ED Notes (Signed)
States she has pressure in the back of her head that comes and goes and has been bothering her the past few weeks. When her adrenaline gets pumping its worse. Sensitivity to light and nausea, blurred vision when the pressure comes on.

## 2015-04-04 DIAGNOSIS — R51 Headache: Secondary | ICD-10-CM

## 2015-04-04 DIAGNOSIS — R519 Headache, unspecified: Secondary | ICD-10-CM | POA: Insufficient documentation

## 2015-04-04 NOTE — Assessment & Plan Note (Signed)
Patient presents today for a same-day appointment with complaints of headache. Etiology currently unknown however differential diagnosis includes migraine without aura versus muscular spasms. After physical exam there appears to be a MSK quality to her symptoms. Outpatient of the muscles of the posterior neck seem to make headache slightly worse. However, patient does seem to have some sensitivity to light and sound making migraine without aura an equally possible diagnosis. - IM Toradol 60 mg in office today - Flexeril as needed - Patient encouraged to take Tylenol for persistent symptoms. - Patient encouraged to stay well-hydrated. - Follow-up in our office if symptoms persist or worsen.

## 2015-04-14 ENCOUNTER — Emergency Department (HOSPITAL_COMMUNITY)
Admission: EM | Admit: 2015-04-14 | Discharge: 2015-04-15 | Disposition: A | Discharge status: Home / Self Care | Payer: Medicaid Other | Attending: Emergency Medicine | Admitting: Emergency Medicine

## 2015-04-14 ENCOUNTER — Encounter (HOSPITAL_COMMUNITY): Payer: Self-pay | Admitting: Family Medicine

## 2015-04-14 DIAGNOSIS — Z87891 Personal history of nicotine dependence: Secondary | ICD-10-CM | POA: Insufficient documentation

## 2015-04-14 DIAGNOSIS — R112 Nausea with vomiting, unspecified: Secondary | ICD-10-CM | POA: Diagnosis not present

## 2015-04-14 DIAGNOSIS — R51 Headache: Secondary | ICD-10-CM | POA: Diagnosis present

## 2015-04-14 DIAGNOSIS — H53149 Visual discomfort, unspecified: Secondary | ICD-10-CM | POA: Insufficient documentation

## 2015-04-14 DIAGNOSIS — R519 Headache, unspecified: Secondary | ICD-10-CM

## 2015-04-14 DIAGNOSIS — Z791 Long term (current) use of non-steroidal anti-inflammatories (NSAID): Secondary | ICD-10-CM | POA: Diagnosis not present

## 2015-04-14 MED ORDER — OXYCODONE-ACETAMINOPHEN 5-325 MG PO TABS
ORAL_TABLET | ORAL | Status: AC
  Filled 2015-04-14: qty 1

## 2015-04-14 MED ORDER — OXYCODONE-ACETAMINOPHEN 5-325 MG PO TABS
1.0000 | ORAL_TABLET | Freq: Once | ORAL | Status: AC
  Administered 2015-04-14: 1 via ORAL

## 2015-04-14 MED ORDER — DIPHENHYDRAMINE HCL 50 MG/ML IJ SOLN
25.0000 mg | Freq: Once | INTRAMUSCULAR | Status: AC
  Administered 2015-04-14: 25 mg via INTRAVENOUS
  Filled 2015-04-14: qty 1

## 2015-04-14 MED ORDER — METOCLOPRAMIDE HCL 5 MG/ML IJ SOLN
10.0000 mg | Freq: Once | INTRAMUSCULAR | Status: AC
  Administered 2015-04-14: 10 mg via INTRAVENOUS
  Filled 2015-04-14: qty 2

## 2015-04-14 MED ORDER — SODIUM CHLORIDE 0.9 % IV BOLUS (SEPSIS)
1000.0000 mL | Freq: Once | INTRAVENOUS | Status: AC
  Administered 2015-04-14: 1000 mL via INTRAVENOUS

## 2015-04-14 NOTE — ED Notes (Signed)
Pt here for migraine x 2 weeks. sts photophobia, nausea, vomiting.

## 2015-04-14 NOTE — ED Provider Notes (Signed)
CSN: [id number]     Arrival date & time 04/14/15  1712 History   First MD Initiated Contact with Patient 04/14/15 2303     Chief Complaint  Patient presents with  . Migraine     Patient is a 25 y.o. female presenting with migraines. The history is provided by the patient.  No language interpreter was used.  Migraine   Kimberly Bradley is a 25 y.o. female who presents to the Emergency Department complaining of headache. She reports 2 weeks of right-sided frontal headache. She has associated photophobia, nausea, vomiting. Is difficulty seeing while driving at night. She has a history of migraine headaches but this is different for her because the headache has persisted and is more severe than usual. Denies any numbness, weakness, fevers.  Past Medical History  Diagnosis Date  . Headache(784.0)   . SVD (spontaneous vaginal delivery)     x 3   Past Surgical History  Procedure Laterality Date  . Hernia repair  2010  . Laparoscopic tubal ligation  05/03/2012    Procedure: LAPAROSCOPIC TUBAL LIGATION;  Surgeon: Lavonia Drafts, MD;  Location: [facility];  Service: Gynecology;  Laterality: N/A;   History reviewed. No pertinent family history. Social History  Substance Use Topics  . Smoking status: Former Smoker -- 1.00 packs/day for 6 years    Types: Cigarettes    Quit date: 11/12/2011  . Smokeless tobacco: Never Used  . Alcohol Use: Yes     Comment: socially   OB History    Gravida Para Term Preterm AB TAB SAB Ectopic Multiple Living   4 3 3  1  1   3      Review of Systems  All other systems reviewed and are negative.     Allergies  Tramadol hcl and Codeine  Home Medications   Prior to Admission medications   Medication Sig Start Date End Date Taking? Authorizing Provider  acetaminophen (TYLENOL) 325 MG tablet Take 650 mg by mouth every 6 (six) hours as needed for headache.    Historical Provider, MD  cyclobenzaprine (FLEXERIL) 10 MG tablet Take 1 tablet (10 mg total)  by mouth 3 (three) times daily as needed for muscle spasms. 04/03/15   Elberta Leatherwood, MD  ibuprofen (ADVIL,MOTRIN) 800 MG tablet Take 1 tablet (800 mg total) by mouth 3 (three) times daily. 05/10/14   April Palumbo, MD  ondansetron (ZOFRAN) 4 MG tablet Take 1 tablet (4 mg total) by mouth every 6 (six) hours. 04/15/15   Quintella Reichert, MD   BP 104/62 mmHg  Pulse 72  Temp(Src) 98.2 F (36.8 C) (Oral)  Resp 16  Ht 5\' 6"  (1.676 m)  Wt 116 lb (52.617 kg)  BMI 18.73 kg/m2  SpO2 99%  LMP 04/14/2015 Physical Exam  Constitutional: She is oriented to person, place, and time. She appears well-developed and well-nourished.  HENT:  Head: Normocephalic and atraumatic.  Eyes: EOM are normal. Pupils are equal, round, and reactive to light.  Cardiovascular: Normal rate and regular rhythm.   No murmur heard. Pulmonary/Chest: Effort normal and breath sounds normal. No respiratory distress.  Abdominal: Soft. There is no tenderness. There is no rebound and no guarding.  Musculoskeletal: She exhibits no edema or tenderness.  Neurological: She is alert and oriented to person, place, and time. No cranial nerve deficit.  Skin: Skin is warm and dry.  Psychiatric: She has a normal mood and affect. Her behavior is normal.  Nursing note and vitals reviewed.   ED Course  Procedures (including critical care time) Labs Review Labs Reviewed - No data to display  Imaging Review Ct Head Wo Contrast  04/15/2015  CLINICAL DATA:  Subacute onset of migraine headache. Vomiting and nausea. Initial encounter. EXAM: CT HEAD WITHOUT CONTRAST TECHNIQUE: Contiguous axial images were obtained from the base of the skull through the vertex without intravenous contrast. COMPARISON:  None. FINDINGS: There is no evidence of acute infarction, mass lesion, or intra- or extra-axial hemorrhage on CT. Prominence of the occipital horns of the lateral ventricles, particularly on the left, is thought to reflect a normal variant, as the third  ventricle is relatively decompressed and the fourth ventricle has a somewhat unusual configuration. The posterior fossa, including the cerebellum, brainstem and fourth ventricle, is within normal limits. The third and lateral ventricles, and basal ganglia are unremarkable in appearance. The cerebral hemispheres are symmetric in appearance, with normal gray-white differentiation. No mass effect or midline shift is seen. There is no evidence of fracture; visualized osseous structures are unremarkable in appearance. The visualized portions of the orbits are within normal limits. The paranasal sinuses and mastoid air cells are well-aerated. No significant soft tissue abnormalities are seen. IMPRESSION: 1. No acute intracranial pathology seen on CT. 2. Prominence of the occipital horns of the lateral ventricles, particularly on the left, is thought to reflect a normal variant. Electronically Signed   By: Garald Balding M.D.   On: 04/15/2015 00:28   I have personally reviewed and evaluated these images and lab results as part of my medical decision-making.   EKG Interpretation None      MDM   Final diagnoses:  Bad headache    Patient here for evaluation of headaches, has history of migraines. She is nontoxic appearing on examination with no focal neurologic examination. Headache is improved on repeat evaluation. CT scan obtained given the persistence of her headache change from her baseline in terms of headache. Presentation is not consistent with subarachnoid hemorrhage or meningitis. Discussed outpatient PCP follow-up as well as neurology follow-up for recurrent headaches.    Quintella Reichert, MD 04/15/15 [phone]

## 2015-04-15 ENCOUNTER — Emergency Department (HOSPITAL_COMMUNITY): Payer: Medicaid Other

## 2015-04-15 MED ORDER — ONDANSETRON HCL 4 MG PO TABS: 4.0000 mg | ORAL_TABLET | Freq: Four times a day (QID) | ORAL | Status: DC

## 2015-04-15 MED ORDER — DEXAMETHASONE SODIUM PHOSPHATE 10 MG/ML IJ SOLN
10.0000 mg | Freq: Once | INTRAMUSCULAR | Status: AC
  Administered 2015-04-15: 10 mg via INTRAVENOUS
  Filled 2015-04-15: qty 1

## 2015-04-15 MED ORDER — KETOROLAC TROMETHAMINE 15 MG/ML IJ SOLN
15.0000 mg | Freq: Once | INTRAMUSCULAR | Status: AC
  Administered 2015-04-15: 15 mg via INTRAVENOUS
  Filled 2015-04-15: qty 1

## 2015-04-15 NOTE — Discharge Instructions (Signed)

## 2015-04-18 ENCOUNTER — Ambulatory Visit: Payer: Medicaid Other | Admitting: Family Medicine

## 2015-04-29 ENCOUNTER — Emergency Department (HOSPITAL_COMMUNITY): Payer: Medicaid Other

## 2015-04-29 ENCOUNTER — Emergency Department (HOSPITAL_COMMUNITY)
Admission: EM | Admit: 2015-04-29 | Discharge: 2015-04-29 | Disposition: A | Discharge status: Home / Self Care | Payer: Medicaid Other | Attending: Emergency Medicine | Admitting: Emergency Medicine

## 2015-04-29 ENCOUNTER — Encounter (HOSPITAL_COMMUNITY): Payer: Self-pay

## 2015-04-29 DIAGNOSIS — Y9289 Other specified places as the place of occurrence of the external cause: Secondary | ICD-10-CM | POA: Insufficient documentation

## 2015-04-29 DIAGNOSIS — S6991XA Unspecified injury of right wrist, hand and finger(s), initial encounter: Secondary | ICD-10-CM

## 2015-04-29 DIAGNOSIS — Y998 Other external cause status: Secondary | ICD-10-CM | POA: Insufficient documentation

## 2015-04-29 DIAGNOSIS — Z791 Long term (current) use of non-steroidal anti-inflammatories (NSAID): Secondary | ICD-10-CM | POA: Insufficient documentation

## 2015-04-29 DIAGNOSIS — Z87891 Personal history of nicotine dependence: Secondary | ICD-10-CM | POA: Insufficient documentation

## 2015-04-29 DIAGNOSIS — Y9389 Activity, other specified: Secondary | ICD-10-CM | POA: Insufficient documentation

## 2015-04-29 DIAGNOSIS — S62339A Displaced fracture of neck of unspecified metacarpal bone, initial encounter for closed fracture: Secondary | ICD-10-CM

## 2015-04-29 DIAGNOSIS — W228XXA Striking against or struck by other objects, initial encounter: Secondary | ICD-10-CM | POA: Diagnosis not present

## 2015-04-29 DIAGNOSIS — S62316A Displaced fracture of base of fifth metacarpal bone, right hand, initial encounter for closed fracture: Secondary | ICD-10-CM | POA: Insufficient documentation

## 2015-04-29 DIAGNOSIS — S62318A Displaced fracture of base of other metacarpal bone, initial encounter for closed fracture: Secondary | ICD-10-CM

## 2015-04-29 MED ORDER — HYDROCODONE-ACETAMINOPHEN 5-325 MG PO TABS: 1.0000 | ORAL_TABLET | Freq: Four times a day (QID) | ORAL | Status: DC | PRN

## 2015-04-29 MED ORDER — HYDROCODONE-ACETAMINOPHEN 5-325 MG PO TABS
1.0000 | ORAL_TABLET | Freq: Once | ORAL | Status: AC
  Administered 2015-04-29: 1 via ORAL
  Filled 2015-04-29: qty 1

## 2015-04-29 MED ORDER — NAPROXEN 500 MG PO TABS: 500.0000 mg | ORAL_TABLET | Freq: Two times a day (BID) | ORAL | Status: DC | PRN

## 2015-04-29 NOTE — Discharge Instructions (Signed)
Wear wrist brace at all times until you see the hand specialist. Ice and elevate hand throughout the day. Alternate between naprosyn and norco for pain relief. Do not drive or operate machinery with pain medication use. Call Dr. Vanetta Shawl office tomorrow to verify your appointment on Thursday at 7:45 AM at his office. HE WILL BE EXPECTING YOU THERE ON Thursday 05/01/15 AT 7:45 AM. Return to the ER for changes or worsening symptoms.    Metacarpal Fracture Fractures of metacarpals are breaks in the bones of the hand. They extend from the knuckles to the wrist. These bones can break in many ways. There are different ways of treating these fractures. HOME CARE  Only exercise as told by your doctor.  Return to activities as told by your doctor.  Go to physical therapy as told by your doctor.  Follow your doctor's advice about driving.  Keep the injured hand raised (elevated) above the level of your heart.  If a plaster, fiberglass, or pre-formed splint was applied:  Wear your splint as told and until you are examined again.  Apply ice on the injury for 15-20 minutes at a time, 03-04 times a day. Put the ice in a plastic bag. Place a towel between your skin and the bag.  Do not get your splint or cast wet. Protect it during bathing with a plastic bag.  Loosen the elastic bandage around the splint if your fingers start to get numb, tingle, get cold, or turn blue.  If the splint is plaster, do not lean it on hard surfaces or put pressure on it for 24 hours after it is put on.  Do not  try to scratch the skin under the cast.  Check the skin around the cast every day. You may put lotion on red or sore areas.  Move the fingers of your casted hand several times a day.  Only take medicine as told by your doctor.  Follow up as told by your doctor. This is very important in order to avoid permanent injury, disability, or lasting (chronic) pain. GET HELP RIGHT AWAY IF:   You develop a  rash.  You have problems breathing.  You have any allergy problems.  You have more than a small spot of blood from beneath your cast or splint.  You have redness, puffiness (swelling), or more pain from beneath your cast or splint.  Yellowish-white fluid (pus) comes from beneath your cast or splint.  You develop a temperature by mouth above 102 F (38.9 C), not controlled by medicine.  You have a bad smell coming from under your cast or splint.  You have problems moving any of your fingers. If you do not have a window in your cast for looking at the wound, a fluid or a little bleeding may show up as a stain on the outside of your cast. Tell your doctor about any stains you see. MAKE SURE YOU:   Understand these instructions.  Will watch your condition.  Will get help right away if you are not doing well or get worse.   This information is not intended to replace advice given to you by your health care provider. Make sure you discuss any questions you have with your health care provider.   Document Released: 09/01/2007 Document Revised: 04/05/2014 Document Reviewed: 01/02/2014 Elsevier Interactive Patient Education 2016 [place] or Splint Care Casts and splints support injured limbs and keep bones from moving while they heal. It is important to care  for your cast or splint at home.  HOME CARE INSTRUCTIONS  Keep the cast or splint uncovered during the drying period. It can take 24 to 48 hours to dry if it is made of plaster. A fiberglass cast will dry in less than 1 hour.  Do not rest the cast on anything harder than a pillow for the first 24 hours.  Do not put weight on your injured limb or apply pressure to the cast until your health care provider gives you permission.  Keep the cast or splint dry. Wet casts or splints can lose their shape and may not support the limb as well. A wet cast that has lost its shape can also create harmful pressure on your skin when  it dries. Also, wet skin can become infected.  Cover the cast or splint with a plastic bag when bathing or when out in the rain or snow. If the cast is on the trunk of the body, take sponge baths until the cast is removed.  If your cast does become wet, dry it with a towel or a blow dryer on the cool setting only.  Keep your cast or splint clean. Soiled casts may be wiped with a moistened cloth.  Do not place any hard or soft foreign objects under your cast or splint, such as cotton, toilet paper, lotion, or powder.  Do not try to scratch the skin under the cast with any object. The object could get stuck inside the cast. Also, scratching could lead to an infection. If itching is a problem, use a blow dryer on a cool setting to relieve discomfort.  Do not trim or cut your cast or remove padding from inside of it.  Exercise all joints next to the injury that are not immobilized by the cast or splint. For example, if you have a long leg cast, exercise the hip joint and toes. If you have an arm cast or splint, exercise the shoulder, elbow, thumb, and fingers.  Elevate your injured arm or leg on 1 or 2 pillows for the first 1 to 3 days to decrease swelling and pain.It is best if you can comfortably elevate your cast so it is higher than your heart. SEEK MEDICAL CARE IF:   Your cast or splint cracks.  Your cast or splint is too tight or too loose.  You have unbearable itching inside the cast.  Your cast becomes wet or develops a soft spot or area.  You have a bad smell coming from inside your cast.  You get an object stuck under your cast.  Your skin around the cast becomes red or raw.  You have new pain or worsening pain after the cast has been applied. SEEK IMMEDIATE MEDICAL CARE IF:   You have fluid leaking through the cast.  You are unable to move your fingers or toes.  You have discolored (blue or white), cool, painful, or very swollen fingers or toes beyond the cast.  You  have tingling or numbness around the injured area.  You have severe pain or pressure under the cast.  You have any difficulty with your breathing or have shortness of breath.  You have chest pain.   This information is not intended to replace advice given to you by your health care provider. Make sure you discuss any questions you have with your health care provider.   Document Released: 03/12/2000 Document Revised: 01/03/2013 Document Reviewed: 09/21/2012 Elsevier Interactive Patient Education [employer].

## 2015-04-29 NOTE — ED Notes (Signed)
Pt c/o right hand pain and pinky pain.  Hit wooden post last Wednesday.

## 2015-04-29 NOTE — ED Notes (Signed)
Pt stable, ambulatory, states understanding of discharge instructions 

## 2015-04-29 NOTE — ED Provider Notes (Signed)
CSN: [id number]     Arrival date & time 04/29/15  2112 History  By signing my name below, I, Helane Gunther, attest that this documentation has been prepared under the direction and in the presence of Azar South Camprubi-Soms, PA-C. Electronically Signed: Helane Gunther, ED Scribe. 04/29/2015. 10:16 PM.     Chief Complaint  Patient presents with  . Hand Injury   Patient is a 25 y.o. female presenting with hand injury. The history is provided by the patient. No language interpreter was used.  Hand Injury Location:  Hand Time since incident:  6 days Injury: yes   Mechanism of injury comment:  Punched a fence post Hand location:  Dorsum of R hand Pain details:    Quality:  Throbbing   Radiates to:  Does not radiate   Severity:  Moderate   Onset quality:  Sudden   Duration:  6 days   Timing:  Constant   Progression:  Unchanged Chronicity:  New Handedness:  Right-handed Prior injury to area:  No Relieved by:  None tried Worsened by:  Nothing tried Ineffective treatments:  None tried Associated symptoms: swelling   Associated symptoms: no decreased range of motion, no fever, no muscle weakness, no numbness and no tingling    HPI Comments: Kimberly Bradley is a 25 y.o. female former smoker, who presents to the Emergency Department complaining of an injury to the right hand sustained 6 days ago. Pt states she struck a wooden post, after which the pain began. She reports associated 7/10 constant, throbbing nonradiating pain to the right hand, as well as swelling and bruising. She has not taken anything for this. Pain worsens with hand movement. Pt denies numbness, tingling, focal weakness, loss of ROM to fingers, CP, SOB, fever, chills, n/v/d, constipation, abdominal pain, and urinary symptoms.  No skin injury or openings. Denies that she punched a person, swears that it was a wooden fence post. Pt is allergic to Tramadol HCl and codeine (rash).   Past Medical History  Diagnosis Date  .  Headache(784.0)   . SVD (spontaneous vaginal delivery)     x 3   Past Surgical History  Procedure Laterality Date  . Hernia repair  2010  . Laparoscopic tubal ligation  05/03/2012    Procedure: LAPAROSCOPIC TUBAL LIGATION;  Surgeon: Lavonia Drafts, MD;  Location: [facility];  Service: Gynecology;  Laterality: N/A;   History reviewed. No pertinent family history. Social History  Substance Use Topics  . Smoking status: Former Smoker -- 1.00 packs/day for 6 years    Types: Cigarettes    Quit date: 11/12/2011  . Smokeless tobacco: Never Used  . Alcohol Use: Yes     Comment: socially   OB History    Gravida Para Term Preterm AB TAB SAB Ectopic Multiple Living   4 3 3  1  1   3      Review of Systems  Constitutional: Negative for fever and chills.  Respiratory: Negative for shortness of breath.   Cardiovascular: Negative for chest pain.  Gastrointestinal: Negative for nausea, vomiting, abdominal pain, diarrhea and constipation.  Genitourinary: Negative for dysuria and hematuria.  Musculoskeletal: Positive for joint swelling and arthralgias (R hand).  Skin: Positive for color change. Negative for wound.  Allergic/Immunologic: Negative for immunocompromised state.  Neurological: Negative for weakness and numbness.   10 Systems reviewed and all are negative for acute change except as noted in the HPI.   Allergies  Tramadol hcl and Codeine  Home Medications   Prior  to Admission medications   Medication Sig Start Date End Date Taking? Authorizing Provider  acetaminophen (TYLENOL) 325 MG tablet Take 650 mg by mouth every 6 (six) hours as needed for headache.    Historical Provider, MD  cyclobenzaprine (FLEXERIL) 10 MG tablet Take 1 tablet (10 mg total) by mouth 3 (three) times daily as needed for muscle spasms. 04/03/15   Elberta Leatherwood, MD  ibuprofen (ADVIL,MOTRIN) 800 MG tablet Take 1 tablet (800 mg total) by mouth 3 (three) times daily. 05/10/14   April Palumbo, MD  ondansetron  (ZOFRAN) 4 MG tablet Take 1 tablet (4 mg total) by mouth every 6 (six) hours. 04/15/15   Quintella Reichert, MD   BP 119/72 mmHg  Pulse 69  Temp(Src) 98.6 F (37 C) (Oral)  Resp 18  SpO2 100%  LMP 04/15/2015 Physical Exam  Constitutional: She is oriented to person, place, and time. Vital signs are normal. She appears well-developed and well-nourished.  Non-toxic appearance. No distress.  Afebrile, nontoxic, NAD  HENT:  Head: Normocephalic and atraumatic.  Mouth/Throat: Mucous membranes are normal.  Eyes: Conjunctivae and EOM are normal. Right eye exhibits no discharge. Left eye exhibits no discharge.  Neck: Normal range of motion. Neck supple.  Cardiovascular: Normal rate and intact distal pulses.   Pulmonary/Chest: Effort normal. No respiratory distress.  Abdominal: Normal appearance. She exhibits no distension.  Musculoskeletal: Normal range of motion.       Right hand: She exhibits tenderness, bony tenderness and swelling. She exhibits normal range of motion, normal two-point discrimination, normal capillary refill, no deformity and no laceration. Normal sensation noted. Normal strength noted.  R hand with bruising and TTP over the 4th and 5th metacarpals, mild swelling in this area, no crepitus or deformity, FROM intact in all digits, no skin injury or lacerations, cap refill brisk and present, strength and sensation grossly intact, although grip strength slightly diminished due to pain. NO FIGHT BITE INJURY NOTED  Neurological: She is alert and oriented to person, place, and time. She has normal strength. No sensory deficit.  Skin: Skin is warm, dry and intact. No rash noted.  Psychiatric: She has a normal mood and affect. Her behavior is normal.  Nursing note and vitals reviewed.   ED Course  Procedures   SPLINT APPLICATION Date/Time: [id number] PM Authorized by: Corine Shelter Consent: Verbal consent obtained. Risks and benefits: risks, benefits and alternatives were  discussed Consent given by: patient Splint applied by: orthopedic technician Location details: R hand Splint type: ulnar gutter Supplies used: orthoglas Post-procedure: The splinted body part was neurovascularly unchanged following the procedure. Patient tolerance: Patient tolerated the procedure well with no immediate complications.    DIAGNOSTIC STUDIES: Oxygen Saturation is 100% on RA, normal by my interpretation.    COORDINATION OF CARE: 9:45 PM - Discussed plans to order diagnostic imaging and medication for pain. Pt advised of plan for treatment and pt agrees.  Labs Review Labs Reviewed - No data to display  Imaging Review Dg Hand Complete Right  04/29/2015  CLINICAL DATA:  Right hand pain and swelling of the fourth and fifth metacarpals after hand struck a wooden fence 6 days prior. EXAM: RIGHT HAND - COMPLETE 3+ VIEW COMPARISON:  None. FINDINGS: Minimally displaced and comminuted angulated fracture of the distal fifth metacarpal. No articular extension. Associated soft tissue edema. No additional fracture of the hand. IMPRESSION: Mildly displaced angulated distal fifth metacarpal fracture. Electronically Signed   By: Jeb Levering M.D.   On: 04/29/2015 22:09  I have personally reviewed and evaluated these images and lab results as part of my medical decision-making.   EKG Interpretation None      MDM   Final diagnoses:  Hand injury, right, initial encounter  Boxer's fracture, closed, initial encounter  Fracture of fifth metacarpal bone, closed, initial encounter    25 y.o. female here with R hand injury sustained 6 days ago, NVI with soft compartments, no skin openings or fight-bite marks. Bruising, swelling, and tenderness over 4-5th metacarpals. Some diminished grip strength due to pain but overall strength intact. FROM preserved. Will give pain meds and obtain xray, then reassess  10:20 PM Xray showing comminuted displaced angulated fx of distal 5th  metacarpal. Will discuss case with Dr. Amedeo Plenty (on-call hand specialist) to ensure close f/up and see if any emergent operative intervention is needed now. Will await his call  10:35 PM Gramig returning page, wants to have her placed in ulnar gutter splint now, and send home with instructions to f/up with him at Thurs at 7:45am. Will get this placed then d/c home with pain meds and f/up instructions  11:20 PM Ortho tech finished with ulnar gutter splint, extremity NVI after splint placement. Will give pain meds, discussed RICE and f/up with Dr. Amedeo Plenty on Thursday at 7:45am. I explained the diagnosis and have given explicit precautions to return to the ER including for any other new or worsening symptoms. The patient understands and accepts the medical plan as it's been dictated and I have answered their questions. Discharge instructions concerning home care and prescriptions have been given. The patient is STABLE and is discharged to home in good condition.   I personally performed the services described in this documentation, which was scribed in my presence. The recorded information has been reviewed and is accurate.  BP 119/72 mmHg  Pulse 69  Temp(Src) 98.6 F (37 C) (Oral)  Resp 18  SpO2 100%  LMP 04/15/2015  Meds ordered this encounter  Medications  . HYDROcodone-acetaminophen (NORCO/VICODIN) 5-325 MG per tablet 1 tablet    Sig:   . naproxen (NAPROSYN) 500 MG tablet    Sig: Take 1 tablet (500 mg total) by mouth 2 (two) times daily as needed for mild pain, moderate pain or headache (TAKE WITH MEALS.).    Dispense:  20 tablet    Refill:  0    Order Specific Question:  Supervising Provider    Answer:  Hult, BRIAN [[id number]]  . HYDROcodone-acetaminophen (NORCO) 5-325 MG tablet    Sig: Take 1 tablet by mouth every 6 (six) hours as needed for severe pain.    Dispense:  20 tablet    Refill:  0    Order Specific Question:  Supervising Provider    Answer:  ASUSENA, BOGGESS [[id number]]      Denis Koppel Camprubi-Soms, PA-C 04/29/15 [place], MD 04/30/15 2330

## 2015-08-11 ENCOUNTER — Encounter: Payer: Self-pay | Admitting: Family Medicine

## 2015-08-11 ENCOUNTER — Ambulatory Visit (INDEPENDENT_AMBULATORY_CARE_PROVIDER_SITE_OTHER): Payer: Medicaid Other | Admitting: Family Medicine

## 2015-08-11 VITALS — BP 100/52 | HR 56 | Temp 98.2°F | Ht 67.0 in | Wt 112.7 lb

## 2015-08-11 DIAGNOSIS — R109 Unspecified abdominal pain: Secondary | ICD-10-CM | POA: Insufficient documentation

## 2015-08-11 LAB — POCT URINE PREGNANCY: Preg Test, Ur: NEGATIVE

## 2015-08-11 NOTE — Progress Notes (Signed)
   Subjective:    Patient ID: Kimberly Bradley, female    DOB: 1990-04-04, 25 y.o.   MRN: [id number]  Seen for Same day visit for   CC: abdominal pain   Is having pain since 2014 after having her clipson tubes placed.  She has not seen anyone for this pain before.  Having some pulling and throbbing.  Pain is constant and 6/10 Has been taking pain medication and trying to relax and not really helping.  Localized pain and points to her inguinal regioin on left and right.  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   No vaginal discharge or dysuria.  Uses condoms everytime.  Doesn't feel like a UTI.  No changes in bowel movements.  Denies any constipation or diarrhea.  The pain is getting stronger.  No prior history of STD  No fevers.  No prior history of yeast infection or BV  PMH: history of right ovarian cyst SH: active tobacco user. Denies any alcohol use  Surgical hx: umbilical hernia repair [id number], clipson tubes 2014   Review of Systems   See HPI for ROS. Objective:  BP 100/52 mmHg  Pulse 56  Temp(Src) 98.2 F (36.8 C) (Oral)  Ht 5\' 7"  (1.702 m)  Wt 112 lb 11.2 oz (51.12 kg)  BMI 17.65 kg/m2  LMP 08/05/2015  General: NAD Cardiac: RRR, normal heart sounds, no murmurs Respiratory: CTAB, normal effort Abdomen: soft, nondistended, no hepatic or splenomegaly. Bowel sounds present, no tenderness in her abdomen but tenderness in her pelvis overlying the ovaries bilaterally. Pain exacerbated with hip flexion bilaterally  Extremities: WWP. Skin: warm and dry, no rashes noted     Assessment & Plan:   AP (abdominal pain) The pain is occurring more in her pelvis.  She reports the pain has been ongoing for the past few years and was seen for this in 2015 upon chart review.  Results were normal at that time.  Transvaginal US in 2015 was normal.  She deferred a pelvic exam today  Denies any prior STI to suspect PID   Possible that  this is a post surgical complication  Negative pregnancy test today  - referral to gyn placed.

## 2015-08-11 NOTE — Patient Instructions (Signed)
Thank you for coming in,   I have made a referral to gynecology.   You can use heating pads, Tylenol, or ibuprofen for the pain.  Please bring all of your medications with you to each visit.   Health maintenance items that are due.  Health Maintenance  Topic Date Due  . Pap Smear  04/27/2015  . Flu Shot  10/28/2015  . Tetanus Vaccine  03/14/2022  . HIV Screening  Completed     Sign up for My Chart to have easy access to your labs results, and communication with your Primary care physician   Please feel free to call with any questions or concerns at any time, at [phone]. --Dr. Raeford Razor

## 2015-08-11 NOTE — Assessment & Plan Note (Addendum)
The pain is occurring more in her pelvis.  She reports the pain has been ongoing for the past few years and was seen for this in 2015 upon chart review.  Results were normal at that time.  Transvaginal US in 2015 was normal.  She deferred a pelvic exam today  Denies any prior STI to suspect PID   Possible that this is a post surgical complication  Negative pregnancy test today  - referral to gyn placed.

## 2015-09-11 ENCOUNTER — Encounter: Payer: Medicaid Other | Admitting: Obstetrics & Gynecology

## 2016-01-15 ENCOUNTER — Encounter (HOSPITAL_COMMUNITY): Payer: Self-pay | Admitting: Emergency Medicine

## 2016-01-15 ENCOUNTER — Emergency Department (HOSPITAL_COMMUNITY)
Admission: EM | Admit: 2016-01-15 | Discharge: 2016-01-15 | Disposition: A | Discharge status: Home / Self Care | Payer: Medicaid Other | Attending: Emergency Medicine | Admitting: Emergency Medicine

## 2016-01-15 DIAGNOSIS — Z5321 Procedure and treatment not carried out due to patient leaving prior to being seen by health care provider: Secondary | ICD-10-CM | POA: Insufficient documentation

## 2016-01-15 DIAGNOSIS — R109 Unspecified abdominal pain: Secondary | ICD-10-CM | POA: Insufficient documentation

## 2016-01-15 LAB — CBC
HEMATOCRIT: 39.4 % (ref 36.0–46.0)
HEMOGLOBIN: 13.4 g/dL (ref 12.0–15.0)
MCH: 32.1 pg (ref 26.0–34.0)
MCHC: 34 g/dL (ref 30.0–36.0)
MCV: 94.5 fL (ref 78.0–100.0)
Platelets: 215 10*3/uL (ref 150–400)
RBC: 4.17 MIL/uL (ref 3.87–5.11)
RDW: 12.4 % (ref 11.5–15.5)
WBC: 5.4 10*3/uL (ref 4.0–10.5)

## 2016-01-15 LAB — COMPREHENSIVE METABOLIC PANEL
ALT: 19 U/L (ref 14–54)
ANION GAP: 5 (ref 5–15)
AST: 22 U/L (ref 15–41)
Albumin: 3.8 g/dL (ref 3.5–5.0)
Alkaline Phosphatase: 50 U/L (ref 38–126)
BUN: 7 mg/dL (ref 6–20)
CHLORIDE: 108 mmol/L (ref 101–111)
CO2: 25 mmol/L (ref 22–32)
Calcium: 9.1 mg/dL (ref 8.9–10.3)
Creatinine, Ser: 0.9 mg/dL (ref 0.44–1.00)
GFR calc non Af Amer: >60 mL/min (ref >60)
Glucose, Bld: 88 mg/dL (ref 65–99)
Potassium: 4.2 mmol/L (ref 3.5–5.1)
SODIUM: 138 mmol/L (ref 135–145)
Total Bilirubin: 1.2 mg/dL (ref 0.3–1.2)
Total Protein: 6.3 g/dL — ABNORMAL LOW (ref 6.5–8.1)

## 2016-01-15 LAB — LIPASE, BLOOD: Lipase: 19 U/L (ref 11–51)

## 2016-01-15 MED ORDER — ONDANSETRON 4 MG PO TBDP
4.0000 mg | ORAL_TABLET | Freq: Once | ORAL | Status: AC | PRN
  Administered 2016-01-15: 4 mg via ORAL

## 2016-01-15 MED ORDER — ONDANSETRON 4 MG PO TBDP
ORAL_TABLET | ORAL | Status: AC
  Filled 2016-01-15: qty 1

## 2016-01-15 NOTE — ED Notes (Signed)
Pt informed of delay. Pt stated that she had a pain score of 5 out of 10. [facility] - RN.

## 2016-01-15 NOTE — ED Triage Notes (Signed)
Pt states she does not want to wait any longer for a room. RN advised against it and asked pt to please consider staying. Pt states she is leaving and going to another hospital

## 2016-01-15 NOTE — ED Triage Notes (Signed)
Pt c/o of RLQ pain with n/v but denies any diarrhea. Pt reports 1 episodes of diarrhea.

## 2016-01-16 ENCOUNTER — Ambulatory Visit (INDEPENDENT_AMBULATORY_CARE_PROVIDER_SITE_OTHER): Payer: Medicaid Other | Admitting: Internal Medicine

## 2016-01-16 ENCOUNTER — Encounter: Payer: Self-pay | Admitting: Internal Medicine

## 2016-01-16 ENCOUNTER — Ambulatory Visit (HOSPITAL_COMMUNITY)
Admission: RE | Admit: 2016-01-16 | Discharge: 2016-01-16 | Disposition: A | Discharge status: Home / Self Care | Payer: Medicaid Other | Source: Ambulatory Visit | Attending: Family Medicine | Admitting: Family Medicine

## 2016-01-16 VITALS — BP 107/56 | HR 66 | Temp 98.1°F | Wt 110.2 lb

## 2016-01-16 DIAGNOSIS — R058 Other specified cough: Secondary | ICD-10-CM

## 2016-01-16 DIAGNOSIS — R05 Cough: Secondary | ICD-10-CM

## 2016-01-16 MED ORDER — HYDROCODONE-HOMATROPINE 5-1.5 MG PO TABS: 1.0000 | ORAL_TABLET | Freq: Four times a day (QID) | ORAL | 0 refills | Status: DC | PRN

## 2016-01-16 NOTE — Progress Notes (Signed)
   [facility] Phone: [phone]  Subjective:  Kimberly Bradley is a 25 year old female presenting to clinic with cough and congestion for the last 4-5 days. She states her cough is productive of green/black sputum. She also endorses some left-sided chest tightness that is worse with coughing. She denies any shortness of breath. She has also been having a runny nose and a subjective fever. She denies chills. She has been having some right side pain over the last few days, that is worse with coughing. No nausea or vomiting. She has tried taking Aleve, which has not helped. She also tried taking her mom's hydrocodone prescription which helped her side pain. She states she has a history of multiple episodes of bronchitis. She is allergic to azithromycin so has never been treated for this bronchitis. She denies any sick contacts.  ROS: See HPI for pertinent positives and negatives  Past Medical History- hx of multiple episodes of bronchitis.  Family history reviewed for today's visit. No changes.  Social history- patient is a former smoker. Quit in 2013.  Objective: BP (!) 107/56 (BP Location: Right Arm, Patient Position: Sitting, Cuff Size: Normal)   Pulse 66   Temp 98.1 F (36.7 C) (Oral)   Wt 110 lb 3.2 oz (50 kg)   LMP 12/25/2015 (Approximate)   BMI 17.79 kg/m  Gen: Ill-appearing but non-toxic, coughing intermittently throughout exam. HEENT: NCAT, EOMI, MMM, TMs normal, oropharynx clear Neck: FROM, supple, no cervical lymphadenopathy CV: RRR, no murmur, tenderness to palpation of the sternum.  Resp: Mildly decreased air movement throughout all lung fields, no obviouis focal wheezes or crackles, normal work of breathing. GI: SNTND, BS present, very mild tenderness to palpation in the RLQ, no guarding or organomegaly Msk: No edema, warm, normal tone, moves UE/LE spontaneously Neuro: Alert and oriented, no gross deficits Skin: No rashes, no lesions Psych: Appropriate  behavior  Assessment/Plan: Productive Cough: Patient presents with a few days of cough productive of green/black sputum, congestion, and subjective fevers. She is endorsing some chest tightness and side pain better or worse with coughing. On exam she has mildly decreased air movement throughout all lung fields but has a normal work of breathing. Pulse ox 98%. Differentials include viral infection, CAP, bronchitis. - Given her symptoms of productive cough and subjective fevers, will order a chest x-ray to rule out pneumonia. - Pt provided with prescription for Hycodan to help with cough. Advised patient to stop taking her mother's narcotic pain medications. - Pt should use Ibuprofen and Tylenol as needed for pain. She can also drink hot tea with honey to help her symptoms. - Will follow-up with chest x-ray and treat as appropriate. - Return precautions discussed - Follow-up as needed   Hyman Bible, MD PGY-2

## 2016-01-16 NOTE — Patient Instructions (Signed)
It was so nice to meet you!  I have prescribed a cough medicine. Please use this every 6 hours as needed.  Please go over to the hospital to have a chest x-ray performed. I will call you with these results.  -Dr. Brett Albino

## 2016-04-13 ENCOUNTER — Encounter (HOSPITAL_COMMUNITY): Payer: Self-pay

## 2016-04-13 ENCOUNTER — Emergency Department (HOSPITAL_COMMUNITY)
Admission: EM | Admit: 2016-04-13 | Discharge: 2016-04-14 | Disposition: A | Discharge status: Home / Self Care | Payer: Medicaid Other | Attending: Emergency Medicine | Admitting: Emergency Medicine

## 2016-04-13 DIAGNOSIS — Y92019 Unspecified place in single-family (private) house as the place of occurrence of the external cause: Secondary | ICD-10-CM | POA: Diagnosis not present

## 2016-04-13 DIAGNOSIS — Y9389 Activity, other specified: Secondary | ICD-10-CM | POA: Diagnosis not present

## 2016-04-13 DIAGNOSIS — M546 Pain in thoracic spine: Secondary | ICD-10-CM | POA: Insufficient documentation

## 2016-04-13 DIAGNOSIS — Z79899 Other long term (current) drug therapy: Secondary | ICD-10-CM | POA: Insufficient documentation

## 2016-04-13 DIAGNOSIS — X58XXXA Exposure to other specified factors, initial encounter: Secondary | ICD-10-CM | POA: Insufficient documentation

## 2016-04-13 DIAGNOSIS — Z87891 Personal history of nicotine dependence: Secondary | ICD-10-CM | POA: Insufficient documentation

## 2016-04-13 DIAGNOSIS — Y999 Unspecified external cause status: Secondary | ICD-10-CM | POA: Diagnosis not present

## 2016-04-13 MED ORDER — METHOCARBAMOL 500 MG PO TABS
500.0000 mg | ORAL_TABLET | Freq: Once | ORAL | Status: AC
  Administered 2016-04-14: 500 mg via ORAL
  Filled 2016-04-13: qty 1

## 2016-04-13 MED ORDER — NAPROXEN 250 MG PO TABS
500.0000 mg | ORAL_TABLET | Freq: Once | ORAL | Status: AC
  Administered 2016-04-14: 500 mg via ORAL
  Filled 2016-04-13: qty 2

## 2016-04-13 NOTE — ED Provider Notes (Signed)
[facility] DEPT Provider Note   CSN: [id number] Arrival date & time: 04/13/16  2223     History   Chief Complaint Chief Complaint  Patient presents with  . Back Pain    HPI Kimberly Bradley is a 26 y.o. female.  The history is provided by the patient and medical records.  Back Pain       26 year old female with history of scoliosis, presenting to the ED for upper back pain. Patient reports this is been ongoing for 2 and half weeks after cleaning her house. She did not have any falls or trauma. States her upper back across her shoulders feels very "sore". She's not had any numbness or weakness of her arms or legs. No difficulty walking. She's not tried any medications for her symptoms prior to arrival.  Past Medical History:  Diagnosis Date  . Headache(784.0)   . SVD (spontaneous vaginal delivery)    x 3    Patient Active Problem List   Diagnosis Date Noted  . AP (abdominal pain) 08/11/2015  . Headache 04/04/2015  . Abdominal pain, left lower quadrant 07/13/2013  . Sterilization 05/03/2012  . Small for gestational age 43/16/2013  . Supervision of normal subsequent pregnancy 11/17/2011  . Loss of hearing 05/18/2011  . Abdominal cramping 09/18/2010  . UMBILICAL HERNIA [id number]  . 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 ABNORMAL PAP SMEAR, LGSIL 12/27/2007  . ABORTION, SPONTANEOUS 10/03/2007  . OVARIAN CYST, RIGHT 09/08/2007  . TOBACCO USE, QUIT 05/26/2006    Past Surgical History:  Procedure Laterality Date  . HERNIA REPAIR  2010  . LAPAROSCOPIC TUBAL LIGATION  05/03/2012   Procedure: LAPAROSCOPIC TUBAL LIGATION;  Surgeon: Lavonia Drafts, MD;  Location: [facility];  Service: Gynecology;  Laterality: N/A;    OB History    Gravida Para Term Preterm AB Living   4 3 3   1 3    SAB TAB Ectopic Multiple Live Births   1       3       Home Medications    Prior to Admission medications   Medication Sig Start Date End Date Taking? Authorizing Provider    acetaminophen (TYLENOL) 325 MG tablet Take 650 mg by mouth every 6 (six) hours as needed for headache.    Historical Provider, MD  cyclobenzaprine (FLEXERIL) 10 MG tablet Take 1 tablet (10 mg total) by mouth 3 (three) times daily as needed for muscle spasms. 04/03/15   Elberta Leatherwood, MD  HYDROcodone-acetaminophen (NORCO) 5-325 MG tablet Take 1 tablet by mouth every 6 (six) hours as needed for severe pain. 04/29/15   [place], PA-C  Hydrocodone-Homatropine 5-1.5 MG TABS Take 1 tablet by mouth every 6 (six) hours as needed. 01/16/16   Sela Hua, MD  ibuprofen (ADVIL,MOTRIN) 800 MG tablet Take 1 tablet (800 mg total) by mouth 3 (three) times daily. 05/10/14   April Palumbo, MD  naproxen (NAPROSYN) 500 MG tablet Take 1 tablet (500 mg total) by mouth 2 (two) times daily as needed for mild pain, moderate pain or headache (TAKE WITH MEALS.). 04/29/15   [place], PA-C  ondansetron (ZOFRAN) 4 MG tablet Take 1 tablet (4 mg total) by mouth every 6 (six) hours. 04/15/15   Quintella Reichert, MD    Family History No family history on file.  Social History Social History  Substance Use Topics  . Smoking status: Former Smoker    Packs/day: 1.00    Years: 6.00    Types: Cigarettes  Quit date: 11/12/2011  . Smokeless tobacco: Never Used  . Alcohol use Yes     Comment: socially     Allergies   Tramadol hcl and Codeine   Review of Systems Review of Systems  Musculoskeletal: Positive for back pain.  All other systems reviewed and are negative.    Physical Exam Updated Vital Signs BP 112/79 (BP Location: Left Arm)   Pulse 66   Temp 97.9 F (36.6 C) (Oral)   Resp 16   Ht 5\' 7"  (1.702 m)   Wt 53.2 kg   LMP 04/02/2016   SpO2 100%   BMI 18.36 kg/m   Physical Exam  Constitutional: She is oriented to person, place, and time. She appears well-developed and well-nourished.  HENT:  Head: Normocephalic and atraumatic.  Mouth/Throat: Oropharynx is clear and moist.   Eyes: Conjunctivae and EOM are normal. Pupils are equal, round, and reactive to light.  Neck: Normal range of motion.  Cardiovascular: Normal rate, regular rhythm and normal heart sounds.   Pulmonary/Chest: Effort normal and breath sounds normal.  Abdominal: Soft. Bowel sounds are normal.  Musculoskeletal: Normal range of motion.  Tenderness and spasm noted of the thoracic paraspinal musculature, there is no midline step-off or acute deformity; there is some slight curvature of the spine noted; full ROM of both arms and legs; normal gait  Neurological: She is alert and oriented to person, place, and time.  Skin: Skin is warm and dry.  Psychiatric: She has a normal mood and affect.  Nursing note and vitals reviewed.    ED Treatments / Results  Labs (all labs ordered are listed, but only abnormal results are displayed) Labs Reviewed - No data to display  EKG  EKG Interpretation None       Radiology No results found.  Procedures Procedures (including critical care time)  Medications Ordered in ED Medications  methocarbamol (ROBAXIN) tablet 500 mg (500 mg Oral Given 04/14/16 0014)  naproxen (NAPROSYN) tablet 500 mg (500 mg Oral Given 04/14/16 0014)     Initial Impression / Assessment and Plan / ED Course  I have reviewed the triage vital signs and the nursing notes.  Pertinent labs & imaging results that were available during my care of the patient were reviewed by me and considered in my medical decision making (see chart for details).  Clinical Course    26 year old female here with upper back pain after cleaning a little over 2 weeks ago. Her pain is reproducible on exam with palpation of the thoracic paraspinal muscles. She does have noted spasm. She has no focal neurologic deficits here. We'll plan to treat symptomatically for MSK pain.  FU with PCP encouraged.  Discussed plan with patient, she acknowledged understanding and agreed with plan of care.  Return precautions  given for new or worsening symptoms.  Final Clinical Impressions(s) / ED Diagnoses   Final diagnoses:  Acute bilateral thoracic back pain    New Prescriptions New Prescriptions   METHOCARBAMOL (ROBAXIN) 500 MG TABLET    Take 1 tablet (500 mg total) by mouth 2 (two) times daily.   NAPROXEN (NAPROSYN) 500 MG TABLET    Take 1 tablet (500 mg total) by mouth 2 (two) times daily with a meal.     Larene Pickett, PA-C 04/14/16 0034    Lacretia Leigh, MD 04/14/16 1308

## 2016-04-13 NOTE — ED Triage Notes (Signed)
Pt states she has been having upper back pain x 2 weeks; pt denies injury; pt states it started after cleaning up; pt a&ox 4 on arrival. Pt states pain at 7/10 on arrival.

## 2016-04-14 MED ORDER — METHOCARBAMOL 500 MG PO TABS: 500.0000 mg | ORAL_TABLET | Freq: Two times a day (BID) | ORAL | 0 refills | Status: DC

## 2016-04-14 MED ORDER — NAPROXEN 500 MG PO TABS: 500.0000 mg | ORAL_TABLET | Freq: Two times a day (BID) | ORAL | 0 refills | Status: DC

## 2016-04-14 NOTE — Discharge Instructions (Signed)
Take the prescribed medication as directed.  May wish to use heat therapy to help relax muscles as well. °Follow-up with your primary care doctor. °Return to the ED for new or worsening symptoms. °

## 2016-08-04 ENCOUNTER — Emergency Department (HOSPITAL_COMMUNITY): Payer: Medicaid Other

## 2016-08-04 ENCOUNTER — Encounter (HOSPITAL_COMMUNITY): Payer: Self-pay | Admitting: *Deleted

## 2016-08-04 DIAGNOSIS — Y929 Unspecified place or not applicable: Secondary | ICD-10-CM | POA: Insufficient documentation

## 2016-08-04 DIAGNOSIS — X58XXXA Exposure to other specified factors, initial encounter: Secondary | ICD-10-CM | POA: Insufficient documentation

## 2016-08-04 DIAGNOSIS — Y990 Civilian activity done for income or pay: Secondary | ICD-10-CM | POA: Insufficient documentation

## 2016-08-04 DIAGNOSIS — Z87891 Personal history of nicotine dependence: Secondary | ICD-10-CM | POA: Diagnosis not present

## 2016-08-04 DIAGNOSIS — M25562 Pain in left knee: Secondary | ICD-10-CM | POA: Insufficient documentation

## 2016-08-04 DIAGNOSIS — Y9301 Activity, walking, marching and hiking: Secondary | ICD-10-CM | POA: Insufficient documentation

## 2016-08-04 DIAGNOSIS — Z79899 Other long term (current) drug therapy: Secondary | ICD-10-CM | POA: Diagnosis not present

## 2016-08-04 NOTE — ED Triage Notes (Signed)
Pt c/o L knee pain onset today after walking to the store. Pt reports blood clots run in her family and is concerned she may have one

## 2016-08-05 ENCOUNTER — Emergency Department (HOSPITAL_COMMUNITY)
Admission: EM | Admit: 2016-08-05 | Discharge: 2016-08-05 | Disposition: A | Discharge status: Home / Self Care | Payer: Medicaid Other | Attending: Emergency Medicine | Admitting: Emergency Medicine

## 2016-08-05 DIAGNOSIS — M25562 Pain in left knee: Secondary | ICD-10-CM

## 2016-08-05 MED ORDER — HYDROCODONE-ACETAMINOPHEN 5-325 MG PO TABS: 1.0000 | ORAL_TABLET | Freq: Four times a day (QID) | ORAL | 0 refills | Status: DC | PRN

## 2016-08-05 MED ORDER — IBUPROFEN 800 MG PO TABS: 800.0000 mg | ORAL_TABLET | Freq: Three times a day (TID) | ORAL | 0 refills | Status: DC | PRN

## 2016-08-05 NOTE — Discharge Instructions (Signed)
Return here as needed.  Ice and elevate her knee.  Wear the knee immobilizer for comfort.  Follow-up with the orthopedist provided

## 2016-08-05 NOTE — Progress Notes (Signed)
2Orthopedic Tech Progress Note Patient Details:  Kimberly Bradley [birth year] [id number]  Ortho Devices Type of Ortho Device: Knee Immobilizer, Crutches Ortho Device/Splint Location: lle Ortho Device/Splint Interventions: Ordered, Application, Adjustment   Karolee Stamps 08/05/2016, 5:11 AM

## 2016-08-05 NOTE — ED Provider Notes (Signed)
[facility] DEPT Provider Note   CSN: [id number] Arrival date & time: 08/04/16  2237     History   Chief Complaint Chief Complaint  Patient presents with  . Knee Pain    HPI Kimberly Bradley is a 26 y.o. female.  HPI Patient presents to the emergency department with left knee pain that started this morning.  The patient states she does stand and walk a lot at work.  She states she does not have any specific injury.  Patient denies any trauma or falls.  The patient states that she did not take any medication prior to arrival.  She states movement and palpation make the pain worse.  Nothing seems make the pain better.  Patient denies numbness, weakness, back pain, neck pain, nausea, vomiting, abdominal pain or syncope Past Medical History:  Diagnosis Date  . Headache(784.0)   . SVD (spontaneous vaginal delivery)    x 3    Patient Active Problem List   Diagnosis Date Noted  . AP (abdominal pain) 08/11/2015  . Headache 04/04/2015  . Abdominal pain, left lower quadrant 07/13/2013  . Sterilization 05/03/2012  . Small for gestational age 44/16/2013  . Supervision of normal subsequent pregnancy 11/17/2011  . Loss of hearing 05/18/2011  . Abdominal cramping 09/18/2010  . UMBILICAL HERNIA 64/68/0321  . 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 ABNORMAL PAP SMEAR, LGSIL 12/27/2007  . ABORTION, SPONTANEOUS 10/03/2007  . OVARIAN CYST, RIGHT 09/08/2007  . TOBACCO USE, QUIT 05/26/2006    Past Surgical History:  Procedure Laterality Date  . HERNIA REPAIR  2010  . LAPAROSCOPIC TUBAL LIGATION  05/03/2012   Procedure: LAPAROSCOPIC TUBAL LIGATION;  Surgeon: Lavonia Drafts, MD;  Location: [facility];  Service: Gynecology;  Laterality: N/A;    OB History    Gravida Para Term Preterm AB Living   4 3 3   1 3    SAB TAB Ectopic Multiple Live Births   1       3       Home Medications    Prior to Admission medications   Medication Sig Start Date End Date Taking? Authorizing Provider    acetaminophen (TYLENOL) 325 MG tablet Take 650 mg by mouth every 6 (six) hours as needed for headache.    [provider]  cyclobenzaprine (FLEXERIL) 10 MG tablet Take 1 tablet (10 mg total) by mouth 3 (three) times daily as needed for muscle spasms. 04/03/15   McKeag, Marylynn Pearson, MD  HYDROcodone-acetaminophen (NORCO) 5-325 MG tablet Take 1 tablet by mouth every 6 (six) hours as needed for severe pain. 04/29/15   Street, Mercedes, PA-C  Hydrocodone-Homatropine 5-1.5 MG TABS Take 1 tablet by mouth every 6 (six) hours as needed. 01/16/16   Mayo, Pete Pelt, MD  ibuprofen (ADVIL,MOTRIN) 800 MG tablet Take 1 tablet (800 mg total) by mouth 3 (three) times daily. 05/10/14   Palumbo, April, MD  methocarbamol (ROBAXIN) 500 MG tablet Take 1 tablet (500 mg total) by mouth 2 (two) times daily. 04/14/16   Larene Pickett, PA-C  naproxen (NAPROSYN) 500 MG tablet Take 1 tablet (500 mg total) by mouth 2 (two) times daily with a meal. 04/14/16   Larene Pickett, PA-C  ondansetron (ZOFRAN) 4 MG tablet Take 1 tablet (4 mg total) by mouth every 6 (six) hours. 04/15/15   Quintella Reichert, MD    Family History No family history on file.  Social History Social History  Substance Use Topics  . Smoking status: Former Smoker    Packs/day:  1.00    Years: 6.00    Types: Cigarettes    Quit date: 11/12/2011  . Smokeless tobacco: Never Used  . Alcohol use Yes     Comment: socially     Allergies   Tramadol hcl and Codeine   Review of Systems Review of Systems  All other systems negative except as documented in the HPI. All pertinent positives and negatives as reviewed in the HPI. Physical Exam Updated Vital Signs BP 111/81   Pulse 77   Temp 98 F (36.7 C) (Oral)   Resp 16   LMP 07/26/2016   SpO2 99%   Physical Exam  Constitutional: She is oriented to person, place, and time. She appears well-developed and well-nourished. No distress.  HENT:  Head: Normocephalic and atraumatic.  Eyes: Pupils are equal,  round, and reactive to light.  Pulmonary/Chest: Effort normal.  Musculoskeletal:       Left knee: She exhibits normal range of motion, no swelling, no effusion, no deformity, no erythema, normal alignment, no bony tenderness and normal meniscus. Tenderness found. Medial joint line and lateral joint line tenderness noted. No patellar tendon tenderness noted.  Neurological: She is alert and oriented to person, place, and time.  Skin: Skin is warm and dry.  Psychiatric: She has a normal mood and affect.  Nursing note and vitals reviewed.    ED Treatments / Results  Labs (all labs ordered are listed, but only abnormal results are displayed) Labs Reviewed - No data to display  EKG  EKG Interpretation None       Radiology Dg Knee Complete 4 Views Left  Result Date: 08/04/2016 CLINICAL DATA:  Acute onset of left anterior knee pain. Initial encounter. EXAM: LEFT KNEE - COMPLETE 4+ VIEW COMPARISON:  None. FINDINGS: There is no evidence of fracture or dislocation. The joint spaces are preserved. No significant degenerative change is seen; the patellofemoral joint is grossly unremarkable in appearance. A fabella is noted. No significant joint effusion is seen. The visualized soft tissues are normal in appearance. IMPRESSION: No evidence of fracture or dislocation. Electronically Signed   By: Garald Balding M.D.   On: 08/04/2016 23:05    Procedures Procedures (including critical care time)  Medications Ordered in ED Medications - No data to display   Initial Impression / Assessment and Plan / ED Course  I have reviewed the triage vital signs and the nursing notes.  Pertinent labs & imaging results that were available during my care of the patient were reviewed by me and considered in my medical decision making (see chart for details).     Patient has no obvious abnormalities on plain films of her knee.  Patient be placed in a knee immobilizer and advised follow-up with orthopedics.   Told ice and elevate her knee.  Patient agrees the plan.  All questions were answered.  I feel that this is due to the fact she stands a lot at work  Final Clinical Impressions(s) / ED Diagnoses   Final diagnoses:  None    New Prescriptions New Prescriptions   No medications on file     Dalia Heading, PA-C 08/05/16 [place], [place], MD 08/05/16 [phone]

## 2016-08-10 ENCOUNTER — Encounter (INDEPENDENT_AMBULATORY_CARE_PROVIDER_SITE_OTHER): Payer: Self-pay | Admitting: Orthopaedic Surgery

## 2016-08-10 ENCOUNTER — Ambulatory Visit (INDEPENDENT_AMBULATORY_CARE_PROVIDER_SITE_OTHER): Payer: Medicaid Other | Admitting: Orthopaedic Surgery

## 2016-08-10 DIAGNOSIS — M25562 Pain in left knee: Secondary | ICD-10-CM

## 2016-08-10 NOTE — Progress Notes (Addendum)
Office Visit Note   Patient: Kimberly Bradley           Date of Birth: [birth year]           MRN: [id number] Visit Date: 08/10/2016              Requested by: Everrett Coombe, MD [address] [place], [place] [address] PCP: Everrett Coombe, MD   Assessment & Plan: Visit Diagnoses:  1. Acute pain of left knee     Plan: I attempted aspiration was unable to obtain any fluid. I did inject her with cortisone. Compression wrap and was given. Recommend NSAIDs and rest and elevation and ice. If not better we will Consider MRI.  Follow-Up Instructions: Return if symptoms worsen or fail to improve.   Orders:  No orders of the defined types were placed in this encounter.  No orders of the defined types were placed in this encounter.     Procedures: Large Joint Inj Date/Time: 08/10/2016 5:09 PM Performed by: Leandrew Koyanagi Authorized by: Leandrew Koyanagi   Consent Given by:  Patient Timeout: prior to procedure the correct patient, procedure, and site was verified   Indications:  Pain Location:  Knee Site:  L knee Prep: patient was prepped and draped in usual sterile fashion   Needle Size:  22 G Ultrasound Guidance: No   Fluoroscopic Guidance: No   Arthrogram: No   Patient tolerance:  Patient tolerated the procedure well with no immediate complications     Clinical Data: No additional findings.   Subjective: Chief Complaint  Patient presents with  . Left Knee - Pain    Patient is a 26 year old female who had acute onset of left knee pain about a week ago. She denies any injuries. She feels like he is throbbing feels like it's painful to weight-bear. The pain radiates up the thigh.    Review of Systems  Constitutional: Negative.   HENT: Negative.   Eyes: Negative.   Respiratory: Negative.   Cardiovascular: Negative.   Endocrine: Negative.   Musculoskeletal: Negative.   Neurological: Negative.   Hematological: Negative.   Psychiatric/Behavioral: Negative.   All other  systems reviewed and are negative.    Objective: Vital Signs: LMP 07/26/2016   Physical Exam  Constitutional: She is oriented to person, place, and time. She appears well-developed and well-nourished.  HENT:  Head: Normocephalic and atraumatic.  Eyes: EOM are normal.  Neck: Neck supple.  Pulmonary/Chest: Effort normal.  Abdominal: Soft.  Neurological: She is alert and oriented to person, place, and time.  Skin: Skin is warm. Capillary refill takes less than 2 seconds.  Psychiatric: She has a normal mood and affect. Her behavior is normal. Judgment and thought content normal.  Nursing note and vitals reviewed.   Ortho Exam Left knee exam shows a trace effusion. She has guarding with range of motion. Collaterals and cruciates intact. No joint line tenderness. Specialty Comments:  No specialty comments available.  Imaging: No results found.   PMFS History: Patient Active Problem List   Diagnosis Date Noted  . AP (abdominal pain) 08/11/2015  . Headache 04/04/2015  . Abdominal pain, left lower quadrant 07/13/2013  . Sterilization 05/03/2012  . Small for gestational age 24/16/2013  . Supervision of normal subsequent pregnancy 11/17/2011  . Loss of hearing 05/18/2011  . Abdominal cramping 09/18/2010  . UMBILICAL HERNIA 51/04/5850  . 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 ABNORMAL PAP SMEAR, LGSIL 12/27/2007  . ABORTION, SPONTANEOUS 10/03/2007  . OVARIAN CYST,  RIGHT 09/08/2007  . TOBACCO USE, QUIT 05/26/2006   Past Medical History:  Diagnosis Date  . Headache(784.0)   . SVD (spontaneous vaginal delivery)    x 3    No family history on file.  Past Surgical History:  Procedure Laterality Date  . HERNIA REPAIR  2010  . LAPAROSCOPIC TUBAL LIGATION  05/03/2012   Procedure: LAPAROSCOPIC TUBAL LIGATION;  Surgeon: Lavonia Drafts, MD;  Location: [facility];  Service: Gynecology;  Laterality: N/A;   Social History   Occupational History  . Not on file.   Social History  Main Topics  . Smoking status: Former Smoker    Packs/day: 1.00    Years: 6.00    Types: Cigarettes    Quit date: 11/12/2011  . Smokeless tobacco: Never Used  . Alcohol use Yes     Comment: socially  . Drug use: No  . Sexual activity: Not Currently    Birth control/ protection: None

## 2016-09-15 ENCOUNTER — Encounter: Payer: Self-pay | Admitting: Family Medicine

## 2016-09-15 ENCOUNTER — Ambulatory Visit (INDEPENDENT_AMBULATORY_CARE_PROVIDER_SITE_OTHER): Payer: Medicaid Other | Admitting: Family Medicine

## 2016-09-15 ENCOUNTER — Other Ambulatory Visit (HOSPITAL_COMMUNITY)
Admission: RE | Admit: 2016-09-15 | Discharge: 2016-09-15 | Disposition: A | Discharge status: Home / Self Care | Payer: Medicaid Other | Source: Ambulatory Visit | Attending: Family Medicine | Admitting: Family Medicine

## 2016-09-15 VITALS — BP 99/58 | HR 55 | Temp 98.4°F | Wt 113.0 lb

## 2016-09-15 DIAGNOSIS — Z124 Encounter for screening for malignant neoplasm of cervix: Secondary | ICD-10-CM | POA: Diagnosis not present

## 2016-09-15 DIAGNOSIS — N870 Mild cervical dysplasia: Secondary | ICD-10-CM | POA: Diagnosis not present

## 2016-09-15 DIAGNOSIS — R1084 Generalized abdominal pain: Secondary | ICD-10-CM | POA: Diagnosis present

## 2016-09-15 DIAGNOSIS — N898 Other specified noninflammatory disorders of vagina: Secondary | ICD-10-CM

## 2016-09-15 LAB — POCT WET PREP (WET MOUNT)
Clue Cells Wet Prep Whiff POC: NEGATIVE
TRICHOMONAS WET PREP HPF POC: ABSENT

## 2016-09-15 LAB — POCT URINE PREGNANCY: Preg Test, Ur: NEGATIVE

## 2016-09-15 MED ORDER — ONDANSETRON HCL 4 MG PO TABS: 4.0000 mg | ORAL_TABLET | Freq: Four times a day (QID) | ORAL | 0 refills | Status: DC

## 2016-09-15 NOTE — Progress Notes (Signed)
   Subjective: CC: abdominal pain Kimberly Bradley is a 26 y.o. female presenting to clinic today for same day appointment. PCP: Everrett Coombe, MD Concerns today include:  1. Vaginal Discharge Patient reports that discharge started about 1 week ago.  She notes that discharge appears clear/ white.  She denies vaginal odors.  She denies vaginal pruritis, abnormal vaginal bleeding, dysuria, hematuria, pelvic pain, fevers, dyspareunia, post coital bleeding, sore throat, rashes.   She reports nausea, vomiting x1.  She reports nausea with PO intake.  She reports tolerance of PO fluids.  She has used a nausea medication of her mom's that did not help.  Unsure what kind.  No sick contacts.  No history of STIs.  She is sexually active and does not use condoms.  Last unprotected intercourse 1.5 weeks.  Contraception: BTL.  Patient's last menstrual period was 08/15/2016 (approximate).  Allergies  Allergen Reactions  . Tramadol Hcl Nausea And Vomiting    REACTION: nausea  . Codeine Rash    REACTION: Rash, pruritus   Social Hx reviewed. MedHx, current medications and allergies reviewed.  Please see EMR. ROS: Per HPI  Objective: Office vital signs reviewed. BP (!) 99/58   Pulse (!) 55   Temp 98.4 F (36.9 C) (Oral)   Wt 113 lb (51.3 kg)   LMP 08/15/2016 (Approximate)   SpO2 99%   BMI 17.70 kg/m   Physical Examination:  General: Awake, alert, thin female, No acute distress GI: soft, non-tender, non-distended, bowel sounds present x4, no hepatomegaly, no splenomegaly, no masses GU: external vaginal tissue normal, cervix midline, no punctate lesions on cervix appreciated, moderate white thin discharge from cervical os, no bleeding, no cervical motion tenderness, no abdominal/ adnexal masses  Results for orders placed or performed in visit on 09/15/16 (from the past 24 hour(s))  POCT urine pregnancy     Status: None   Collection Time: 09/15/16  2:10 PM  Result Value Ref Range   Preg Test,  Ur Negative Negative  POCT Wet Prep [facility])     Status: Abnormal   Collection Time: 09/15/16  2:10 PM  Result Value Ref Range   Source Wet Prep POC VAG    WBC, Wet Prep HPF POC 5-10    Bacteria Wet Prep HPF POC Many (A) Few   Clue Cells Wet Prep HPF POC None None   Clue Cells Wet Prep Whiff POC Negative Whiff    Yeast Wet Prep HPF POC None    Trichomonas Wet Prep HPF POC Absent Absent    Assessment/ Plan: 26 y.o. female   1. Generalized abdominal pain.  ?gastritis.  Her main concern was pregnancy today.  She had a negative Upreg.  STI testing also performed - POCT urine pregnancy  2. Vaginal discharge - GC/CT testing added to pap smear - POCT Wet Prep [facility])  3. Screening for cervical cancer, last pap 03/2012 and was normal. - Cytology - PAP [place]  Follow up w/ PCP prn  Janora Norlander, DO PGY-3, [facility]

## 2016-09-15 NOTE — Patient Instructions (Signed)
You had a pap smear done today (screening for cervical cancer).  I will contact you will the results of your labs.  If anything is abnormal, I will call you.  Otherwise, expect a copy to be mailed to you.   Pap Test Why am I having this test? A pap test is sometimes called a pap smear. It is a screening test that is used to check for signs of cancer of the vagina, cervix, and uterus. The test can also identify the presence of infection or precancerous changes. Your health care provider will likely recommend you have this test done on a regular basis. This test may be done:  Every 3 years, starting at age 82.  Every 5 years, in combination with testing for the presence of human papillomavirus (HPV).  More or less often depending on other medical conditions.  What kind of sample is taken? Using a small cotton swab, plastic spatula, or brush, your health care provider will collect a sample of cells from the surface of your cervix. Your cervix is the opening to your uterus, also called a womb. Secretions from the cervix and vagina may also be collected. How do I prepare for this test?  Be aware of where you are in your menstrual cycle. You may be asked to reschedule the test if you are menstruating on the day of the test.  You may need to reschedule if you have a known vaginal infection on the day of the test.  You may be asked to avoid douching or taking a bath the day before or the day of the test.  Some medicines can cause abnormal test results, such as digitalis and tetracycline. Talk with your health care provider before your test if you take one of these medicines. What do the results mean? Abnormal test results may indicate a number of health conditions. These may include:  Cancer. Although pap test results cannot be used to diagnose cancer of the cervix, vagina, or uterus, they may suggest the possibility of cancer. Further tests would be required to determine if cancer is  present.  Sexually transmitted disease.  Fungal infection.  Parasite infection.  Herpes infection.  A condition causing or contributing to infertility.  It is your responsibility to obtain your test results. Ask the lab or department performing the test when and how you will get your results. Contact your health care provider to discuss any questions you have about your results. Talk with your health care provider to discuss your results, treatment options, and if necessary, the need for more tests. Talk with your health care provider if you have any questions about your results. This information is not intended to replace advice given to you by your health care provider. Make sure you discuss any questions you have with your health care provider. Document Released: 06/05/2002 Document Revised: 11/19/2015 Document Reviewed: 08/06/2013 Elsevier Interactive Patient Education  [employer].

## 2016-09-19 LAB — CYTOLOGY - PAP
Chlamydia: NEGATIVE
NEISSERIA GONORRHEA: NEGATIVE

## 2016-09-20 ENCOUNTER — Encounter: Payer: Self-pay | Admitting: Family Medicine

## 2016-09-20 ENCOUNTER — Telehealth: Payer: Self-pay | Admitting: Family Medicine

## 2016-09-20 NOTE — Telephone Encounter (Signed)
Attempted to call patient at listed number but it says "we're sorry but we are temporarily unable to connect you with a representative".  Unsure if this is the correct number.  Will place letter in chart to send via certified mail.  Patient needs to make an appt with [facility] for colposcopy.  She has LSIL.  Marikay Roads M. Lajuana Ripple, DO PGY-3, [facility]

## 2016-10-07 ENCOUNTER — Ambulatory Visit (INDEPENDENT_AMBULATORY_CARE_PROVIDER_SITE_OTHER): Payer: Medicaid Other | Admitting: Family Medicine

## 2016-10-07 VITALS — BP 96/76 | HR 53 | Temp 98.3°F | Ht 67.0 in | Wt 111.0 lb

## 2016-10-07 DIAGNOSIS — R1084 Generalized abdominal pain: Secondary | ICD-10-CM

## 2016-10-07 DIAGNOSIS — R87612 Low grade squamous intraepithelial lesion on cytologic smear of cervix (LGSIL): Secondary | ICD-10-CM

## 2016-10-07 LAB — POCT URINE PREGNANCY: Preg Test, Ur: NEGATIVE

## 2016-10-07 NOTE — Progress Notes (Signed)
Pap 2009 LGSIL, normal in 2013 and 2014.  LGSIL in 2018  Patient given informed consent, signed copy in the chart.  Placed in lithotomy position. Cervix viewed with speculum and colposcope after application of acetic acid.   Colposcopy adequate (entire squamocolumnar junctions seen  in entirety) ?  Yes Acetowhite lesions? None Punctation? No Mosaicism?  No Abnormal vasculature?  No Biopsies? None ECC? No Complications? No  COMMENTS:  Patient was given post procedure instructions.   F/u pap in one year is recommended.

## 2016-10-08 ENCOUNTER — Ambulatory Visit (INDEPENDENT_AMBULATORY_CARE_PROVIDER_SITE_OTHER): Payer: Medicaid Other | Admitting: Family Medicine

## 2016-10-08 ENCOUNTER — Encounter: Payer: Self-pay | Admitting: Family Medicine

## 2016-10-08 VITALS — BP 108/70 | HR 75 | Temp 98.4°F | Ht 67.0 in | Wt 110.8 lb

## 2016-10-08 DIAGNOSIS — R1012 Left upper quadrant pain: Secondary | ICD-10-CM

## 2016-10-08 DIAGNOSIS — R1084 Generalized abdominal pain: Secondary | ICD-10-CM

## 2016-10-08 DIAGNOSIS — R1032 Left lower quadrant pain: Secondary | ICD-10-CM | POA: Diagnosis not present

## 2016-10-08 DIAGNOSIS — L989 Disorder of the skin and subcutaneous tissue, unspecified: Secondary | ICD-10-CM

## 2016-10-08 LAB — POCT URINALYSIS DIP (MANUAL ENTRY)
Bilirubin, UA: NEGATIVE
Glucose, UA: NEGATIVE mg/dL
Ketones, POC UA: NEGATIVE mg/dL
Leukocytes, UA: NEGATIVE
Nitrite, UA: NEGATIVE
PH UA: 6 (ref 5.0–8.0)
PROTEIN UA: NEGATIVE mg/dL
SPEC GRAV UA: 1.02 (ref 1.010–1.025)
UROBILINOGEN UA: 0.2 U/dL

## 2016-10-08 LAB — POCT URINE PREGNANCY: PREG TEST UR: NEGATIVE

## 2016-10-08 MED ORDER — DOXYCYCLINE HYCLATE 100 MG PO TABS: 100.0000 mg | ORAL_TABLET | Freq: Two times a day (BID) | ORAL | 0 refills | Status: DC

## 2016-10-08 MED ORDER — HYDROCODONE-ACETAMINOPHEN 5-325 MG PO TABS: 1.0000 | ORAL_TABLET | Freq: Three times a day (TID) | ORAL | 0 refills | Status: DC | PRN

## 2016-10-08 NOTE — Progress Notes (Signed)
Subjective:     Patient ID: Kimberly Bradley, female   DOB: [birth year], 28 y.o.   MRN: [id number]  Abdominal Pain  This is a new problem. The current episode started more than 1 month ago. The onset quality is sudden (She was walking her dog and he yanked the leash and she felt a pop. Since then she has been having belly pain). The problem occurs constantly. The problem has been gradually worsening. The pain is at a severity of 8/10. The quality of the pain is sharp. The abdominal pain radiates to the LUQ and LLQ. Associated symptoms include nausea. Pertinent negatives include no constipation. The pain is aggravated by coughing and movement. The pain is relieved by nothing. She has tried oral narcotic analgesics (use mom's hydrocordone without improvement) for the symptoms. The treatment provided no relief. Hernia repair 2010. Tutal ligation in 2014  Knot: C/O red, painful swelling on her right thigh which started 3 days ago. She stated she woke up with a tiny painful bump which gradually increased in size. She denies fever, no discharge from the swelling. She denies insect or bug bite. She is uncertain what the trigger was.   Current Outpatient Prescriptions on File Prior to Visit  Medication Sig Dispense Refill  . acetaminophen (TYLENOL) 325 MG tablet Take 650 mg by mouth every 6 (six) hours as needed for headache.    . cyclobenzaprine (FLEXERIL) 10 MG tablet Take 1 tablet (10 mg total) by mouth 3 (three) times daily as needed for muscle spasms. 15 tablet 0  . Hydrocodone-Homatropine 5-1.5 MG TABS Take 1 tablet by mouth every 6 (six) hours as needed. 30 each 0  . ibuprofen (ADVIL,MOTRIN) 800 MG tablet Take 1 tablet (800 mg total) by mouth every 8 (eight) hours as needed. 21 tablet 0  . methocarbamol (ROBAXIN) 500 MG tablet Take 1 tablet (500 mg total) by mouth 2 (two) times daily. 20 tablet 0  . naproxen (NAPROSYN) 500 MG tablet Take 1 tablet (500 mg total) by mouth 2 (two) times daily with a meal.  30 tablet 0  . ondansetron (ZOFRAN) 4 MG tablet Take 1 tablet (4 mg total) by mouth every 6 (six) hours. 12 tablet 0   No current facility-administered medications on file prior to visit.    Past Medical History:  Diagnosis Date  . Headache(784.0)   . SVD (spontaneous vaginal delivery)    x 3     Review of Systems  Respiratory: Negative.   Cardiovascular: Negative.   Gastrointestinal: Positive for abdominal pain and nausea. Negative for constipation.  Skin:       Bump on her skin  All other systems reviewed and are negative.  Vitals:   10/08/16 0904  BP: 108/70  Pulse: 75  Temp: 98.4 F (36.9 C)  TempSrc: Oral  SpO2: 99%  Weight: 110 lb 12.8 oz (50.3 kg)  Height: 5\' 7"  (1.702 m)       Objective:   Physical Exam  Constitutional: She is oriented to person, place, and time. She appears well-developed. No distress.  Cardiovascular: Normal rate, regular rhythm and normal heart sounds.   No murmur heard. Pulmonary/Chest: Effort normal and breath sounds normal. No respiratory distress. She has no wheezes.  Abdominal: Soft. She exhibits no distension and no mass. There is no hepatosplenomegaly. There is tenderness in the left upper quadrant and left lower quadrant. There is no rigidity, no rebound, no guarding, no tenderness at McBurney's point and negative Murphy's sign.    Neurological:  She is alert and oriented to person, place, and time.  Nursing note and vitals reviewed.    Urinalysis    Component Value Date/Time   COLORURINE YELLOW 07/16/2008 1813   APPEARANCEUR CLEAR 07/16/2008 1813   LABSPEC 1.026 07/16/2008 1813   PHURINE 5.5 07/16/2008 1813   GLUCOSEU NEGATIVE 07/16/2008 1813   HGBUR MODERATE (A) 07/16/2008 1813   HGBUR negative 07/14/2006 1610   BILIRUBINUR negative 10/08/2016 0854   KETONESUR negative 10/08/2016 0854   KETONESUR NEGATIVE 07/16/2008 1813   PROTEINUR negative 10/08/2016 0854   PROTEINUR NEGATIVE 07/16/2008 1813   UROBILINOGEN 0.2  10/08/2016 0854   UROBILINOGEN 1.0 07/16/2008 1813   NITRITE Negative 10/08/2016 0854   NITRITE NEGATIVE 07/16/2008 1813   LEUKOCYTESUR Negative 10/08/2016 0854        Assessment:     Abdominal pain Insect bite with infection    Plan:     1. Patient reassured given the mechanism of injury, her pain is likely due to muscle strain.     Recent Upreh neg and UA neg for infection.     NSAID or tramadol recommended but she stated she is allergic to both.     She stated she uses her Mom's hydrocodone, which I advised is inappropriate.     I gave her limited hydrocodone for breakthrough pain.     Reassured her this should improve in few weeks.     Return precaution discussed.  2. Red spot on the skin likely due to insect bite with developing infection.     Treat with Doxycycline x 7 days.     Tylenol prn pain.      F/U as needed.

## 2016-10-08 NOTE — Patient Instructions (Signed)
It was nice seeing you today. I am sorry about your abdominal pain. It sounds like muscle strain. Please use Ibuprofen as needed for pain and Hydrocodone for breakthrough pain only.  You skin lesion is likely due to some insect bite. Looks a bit infected. I have given you antibiotic for few days. See PCP soon if this worsens.

## 2017-01-30 ENCOUNTER — Encounter (HOSPITAL_COMMUNITY): Payer: Self-pay | Admitting: Emergency Medicine

## 2017-01-30 ENCOUNTER — Emergency Department (HOSPITAL_COMMUNITY): Payer: Medicaid Other

## 2017-01-30 DIAGNOSIS — R001 Bradycardia, unspecified: Secondary | ICD-10-CM | POA: Insufficient documentation

## 2017-01-30 DIAGNOSIS — R079 Chest pain, unspecified: Secondary | ICD-10-CM | POA: Diagnosis present

## 2017-01-30 DIAGNOSIS — R51 Headache: Secondary | ICD-10-CM | POA: Insufficient documentation

## 2017-01-30 DIAGNOSIS — R110 Nausea: Secondary | ICD-10-CM | POA: Insufficient documentation

## 2017-01-30 DIAGNOSIS — Z79899 Other long term (current) drug therapy: Secondary | ICD-10-CM | POA: Diagnosis not present

## 2017-01-30 DIAGNOSIS — Z87891 Personal history of nicotine dependence: Secondary | ICD-10-CM | POA: Diagnosis not present

## 2017-01-30 LAB — CBC
HCT: 43.3 % (ref 36.0–46.0)
Hemoglobin: 14.5 g/dL (ref 12.0–15.0)
MCH: 32.2 pg (ref 26.0–34.0)
MCHC: 33.5 g/dL (ref 30.0–36.0)
MCV: 96 fL (ref 78.0–100.0)
PLATELETS: 234 10*3/uL (ref 150–400)
RBC: 4.51 MIL/uL (ref 3.87–5.11)
RDW: 12.6 % (ref 11.5–15.5)
WBC: 8.5 10*3/uL (ref 4.0–10.5)

## 2017-01-30 LAB — BASIC METABOLIC PANEL
Anion gap: 9 (ref 5–15)
BUN: 5 mg/dL — AB (ref 6–20)
CALCIUM: 9.3 mg/dL (ref 8.9–10.3)
CO2: 23 mmol/L (ref 22–32)
Chloride: 105 mmol/L (ref 101–111)
Creatinine, Ser: 0.84 mg/dL (ref 0.44–1.00)
GFR calc Af Amer: >60 mL/min (ref >60)
GLUCOSE: 106 mg/dL — AB (ref 65–99)
Potassium: 3.6 mmol/L (ref 3.5–5.1)
Sodium: 137 mmol/L (ref 135–145)

## 2017-01-30 LAB — URINALYSIS, ROUTINE W REFLEX MICROSCOPIC
BACTERIA UA: NONE SEEN
Bilirubin Urine: NEGATIVE
Glucose, UA: NEGATIVE mg/dL
KETONES UR: 5 mg/dL — AB
Leukocytes, UA: NEGATIVE
NITRITE: NEGATIVE
PROTEIN: NEGATIVE mg/dL
Specific Gravity, Urine: 1.021 (ref 1.005–1.030)
pH: 6 (ref 5.0–8.0)

## 2017-01-30 LAB — I-STAT TROPONIN, ED: TROPONIN I, POC: 0 ng/mL (ref 0.00–0.08)

## 2017-01-30 LAB — LIPASE, BLOOD: Lipase: 17 U/L (ref 11–51)

## 2017-01-30 MED ORDER — ONDANSETRON 4 MG PO TBDP
ORAL_TABLET | ORAL | Status: AC
  Filled 2017-01-30: qty 1

## 2017-01-30 MED ORDER — ONDANSETRON 4 MG PO TBDP: 4.0000 mg | ORAL_TABLET | Freq: Once | ORAL | Status: DC | PRN

## 2017-01-30 NOTE — ED Triage Notes (Signed)
Pt presents with CP and n/v that began today around noon; pt states CP is central and radiates to back; vomit x 4 today; pt denies cardiac history; denies SOB, lightheadedness or diaphoresis or being around others who were sick

## 2017-01-31 ENCOUNTER — Emergency Department (HOSPITAL_COMMUNITY)
Admission: EM | Admit: 2017-01-31 | Discharge: 2017-01-31 | Disposition: A | Discharge status: Home / Self Care | Payer: Medicaid Other | Attending: Emergency Medicine | Admitting: Emergency Medicine

## 2017-01-31 DIAGNOSIS — R001 Bradycardia, unspecified: Secondary | ICD-10-CM

## 2017-01-31 DIAGNOSIS — R110 Nausea: Secondary | ICD-10-CM

## 2017-01-31 DIAGNOSIS — R079 Chest pain, unspecified: Secondary | ICD-10-CM

## 2017-01-31 LAB — PREGNANCY, URINE: PREG TEST UR: NEGATIVE

## 2017-01-31 MED ORDER — GI COCKTAIL ~~LOC~~
30.0000 mL | Freq: Once | ORAL | Status: AC
  Administered 2017-01-31: 30 mL via ORAL
  Filled 2017-01-31: qty 30

## 2017-01-31 MED ORDER — OMEPRAZOLE 20 MG PO CPDR: 20.0000 mg | DELAYED_RELEASE_CAPSULE | Freq: Every day | ORAL | 0 refills | Status: DC

## 2017-01-31 NOTE — ED Notes (Signed)
Pt continues to c/o nausea and now a headache

## 2017-01-31 NOTE — ED Provider Notes (Signed)
[facility] EMERGENCY DEPARTMENT Provider Note   CSN: [id number] Arrival date & time: 01/30/17  2032     History   Chief Complaint Chief Complaint  Patient presents with  . Chest Pain    HPI Kimberly Bradley is a 26 y.o. female with a hx of generalized headache presents to the Emergency Department complaining of gradual, persistent, central chest pain onset around noon today.  Pt reports the pain radiated from her chest into her back.  She reports the pain is mild and is worsened with movement.  She does endorse some reflux symptoms and acid wash in the back of her throat.  She denies fevers or chills, cough, congestion.  Patient reports she smokes approximately 6 cigarettes/day.  Associated symptoms include myalgias, fatigue. Pt also reports one episode of nonbloody and nonbilious emesis today.  She denies recent travel, sick contacts.  She denies diarrhea.    The history is provided by the patient and medical records. No language interpreter was used.    Past Medical History:  Diagnosis Date  . Headache(784.0)   . SVD (spontaneous vaginal delivery)    x 3    Patient Active Problem List   Diagnosis Date Noted  . AP (abdominal pain) 08/11/2015  . Headache 04/04/2015  . Abdominal pain, left lower quadrant 07/13/2013  . Sterilization 05/03/2012  . Small for gestational age 99/16/2013  . Supervision of normal subsequent pregnancy 11/17/2011  . Loss of hearing 05/18/2011  . Abdominal cramping 09/18/2010  . UMBILICAL HERNIA 95/63/8756  . 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 ABNORMAL PAP SMEAR, LGSIL 12/27/2007  . ABORTION, SPONTANEOUS 10/03/2007  . OVARIAN CYST, RIGHT 09/08/2007  . TOBACCO USE, QUIT 05/26/2006    Past Surgical History:  Procedure Laterality Date  . HERNIA REPAIR  2010    OB History    Gravida Para Term Preterm AB Living   4 3 3   1 3    SAB TAB Ectopic Multiple Live Births   1       3       Home Medications    Prior to Admission  medications   Medication Sig Start Date End Date Taking? Authorizing Provider  acetaminophen (TYLENOL) 325 MG tablet Take 650 mg by mouth every 6 (six) hours as needed for headache.    [provider]  cyclobenzaprine (FLEXERIL) 10 MG tablet Take 1 tablet (10 mg total) by mouth 3 (three) times daily as needed for muscle spasms. 04/03/15   McKeag, Marylynn Pearson, MD  doxycycline (VIBRA-TABS) 100 MG tablet Take 1 tablet (100 mg total) by mouth 2 (two) times daily. 10/08/16   Kinnie Feil, MD  HYDROcodone-acetaminophen (NORCO/VICODIN) 5-325 MG tablet Take 1 tablet by mouth every 8 (eight) hours as needed for severe pain. 10/08/16   Kinnie Feil, MD  Hydrocodone-Homatropine 5-1.5 MG TABS Take 1 tablet by mouth every 6 (six) hours as needed. 01/16/16   Mayo, Pete Pelt, MD  ibuprofen (ADVIL,MOTRIN) 800 MG tablet Take 1 tablet (800 mg total) by mouth every 8 (eight) hours as needed. 08/05/16   Lawyer, Harrell Gave, PA-C  methocarbamol (ROBAXIN) 500 MG tablet Take 1 tablet (500 mg total) by mouth 2 (two) times daily. 04/14/16   Larene Pickett, PA-C  naproxen (NAPROSYN) 500 MG tablet Take 1 tablet (500 mg total) by mouth 2 (two) times daily with a meal. 04/14/16   Larene Pickett, PA-C  omeprazole (PRILOSEC) 20 MG capsule Take 1 capsule (20 mg total) daily by mouth.  01/31/17   Treyana Sturgell, Jarrett Soho, PA-C  ondansetron (ZOFRAN) 4 MG tablet Take 1 tablet (4 mg total) by mouth every 6 (six) hours. 09/15/16   Janora Norlander, DO    Family History History reviewed. No pertinent family history.  Social History Social History   Tobacco Use  . Smoking status: Former Smoker    Packs/day: 1.00    Years: 6.00    Pack years: 6.00    Types: Cigarettes    Last attempt to quit: 11/12/2011    Years since quitting: 5.2  . Smokeless tobacco: Never Used  Substance Use Topics  . Alcohol use: Yes    Comment: socially  . Drug use: No     Allergies   Tramadol hcl and Codeine   Review of Systems Review of  Systems  Constitutional: Negative for appetite change, diaphoresis, fatigue, fever and unexpected weight change.  HENT: Negative for mouth sores.   Eyes: Negative for visual disturbance.  Respiratory: Negative for cough, chest tightness, shortness of breath and wheezing.   Cardiovascular: Positive for chest pain.  Gastrointestinal: Negative for abdominal pain, constipation, diarrhea, nausea and vomiting.  Endocrine: Negative for polydipsia, polyphagia and polyuria.  Genitourinary: Negative for dysuria, frequency, hematuria and urgency.  Musculoskeletal: Negative for back pain and neck stiffness.  Skin: Negative for rash.  Allergic/Immunologic: Negative for immunocompromised state.  Neurological: Negative for syncope, light-headedness and headaches.  Hematological: Does not bruise/bleed easily.  Psychiatric/Behavioral: Negative for sleep disturbance. The patient is not nervous/anxious.      Physical Exam Updated Vital Signs BP 115/76   Pulse (!) 52   Temp 98 F (36.7 C)   Resp (!) 9   Ht 5\' 7"  (1.702 m)   Wt 49.9 kg (110 lb)   LMP 01/30/2017   SpO2 98%   BMI 17.23 kg/m   Physical Exam  Constitutional: She appears well-developed and well-nourished. No distress.  Awake, alert, nontoxic appearance Pt appears uncomfortable  HENT:  Head: Normocephalic and atraumatic.  Mouth/Throat: Oropharynx is clear and moist. No oropharyngeal exudate.  Eyes: Conjunctivae are normal. No scleral icterus.  Neck: Normal range of motion. Neck supple.  Cardiovascular: Regular rhythm and intact distal pulses. Bradycardia present.  Pulses:      Radial pulses are 2+ on the right side, and 2+ on the left side.       Dorsalis pedis pulses are 2+ on the right side, and 2+ on the left side.  Pulmonary/Chest: Effort normal and breath sounds normal. No respiratory distress. She has no wheezes. She exhibits tenderness.  Equal chest expansion Mild tenderness to palpation  Abdominal: Soft. Bowel sounds are  normal. She exhibits no mass. There is no tenderness. There is no rebound and no guarding.  Musculoskeletal: Normal range of motion. She exhibits no edema.  Neurological: She is alert.  Speech is clear and goal oriented Moves extremities without ataxia  Skin: Skin is warm and dry. She is not diaphoretic.  Psychiatric: She has a normal mood and affect.  Nursing note and vitals reviewed.    ED Treatments / Results  Labs (all labs ordered are listed, but only abnormal results are displayed) Labs Reviewed  BASIC METABOLIC PANEL - Abnormal; Notable for the following components:      Result Value   Glucose, Bld 106 (*)    BUN 5 (*)    All other components within normal limits  URINALYSIS, ROUTINE W REFLEX MICROSCOPIC - Abnormal; Notable for the following components:   Hgb urine dipstick LARGE (*)  Ketones, ur 5 (*)    Squamous Epithelial / LPF 0-5 (*)    All other components within normal limits  CBC  LIPASE, BLOOD  PREGNANCY, URINE  I-STAT TROPONIN, ED    EKG  EKG Interpretation  Date/Time:  "Sunday January 30 2017 20:44:05 EST Ventricular Rate:  62 PR Interval:  144 QRS Duration: 82 QT Interval:  430 QTC Calculation: 436 R Axis:   78 Text Interpretation:  Normal sinus rhythm with sinus arrhythmia Possible Anterior infarct , age undetermined Abnormal ECG No previous tracing Confirmed by Pollina, Christopher J ([id number]) on 01/31/2017 2:04:56 AM       Radiology Dg Chest 2 View  Result Date: 01/30/2017 CLINICAL DATA:  Chest pain EXAM: CHEST  2 VIEW COMPARISON:  01/16/2016 FINDINGS: Hyperinflation. No focal pulmonary infiltrate, consolidation or effusion. A nodular opacity at the right lower lung may reflect a nipple shadow. Normal heart size. No pneumothorax. IMPRESSION: Hyperinflation without acute abnormality. Round nodular opacity at the right lower lung may reflect nipple shadow Electronically Signed   By: Kim  Fujinaga M.D.   On: 01/30/2017 22:45     Procedures Procedures (including critical care time)  Medications Ordered in ED Medications  ondansetron (ZOFRAN-ODT) disintegrating tablet 4 mg (not administered)  gi cocktail (Maalox,Lidocaine,Donnatal) (30 mLs Oral Given 01/31/17 0058)     Initial Impression / Assessment and Plan / ED Course  I have reviewed the triage vital signs and the nursing notes.  Pertinent labs & imaging results that were available during my care of the patient were reviewed by me and considered in my medical decision making (see chart for details).     Patient is to be discharged with recommendation to follow up with PCP in regards to today's hospital visit. Chest pain is not likely of cardiac or pulmonary etiology d/t presentation, PERC negative, VSS, no tracheal deviation, no JVD or new murmur, RRR, breath sounds equal bilaterally, EKG without acute abnormalities, negative troponin, and negative CXR.  Patient with improvement in symptoms after GI cocktail.  Suspect reflux is contributing.  Patient is noted to have some bradycardia.  In looking back at previous visits it appears as if she has previously had heart rates in the 50s consistently.  This is not appear to be new.  Pt has been advised to return to the ED if CP becomes exertional, associated with diaphoresis or nausea, radiates to left jaw/arm, worsens or becomes concerning in any way. Pt appears reliable for follow up and is agreeable to discharge.     Final Clinical Impressions(s) / ED Diagnoses   Final diagnoses:  Central chest pain  Bradycardia  Nausea    ED Discharge Orders        Ordered    omeprazole (PRILOSEC) 20 MG capsule  Daily     11" /05/18 0201       Cherisa Brucker, Jarrett Soho, PA-C 01/31/17 8478    Varney Biles, MD 01/31/17 4128

## 2017-01-31 NOTE — Discharge Instructions (Signed)
1. Medications: usual home medications 2. Treatment: rest, drink plenty of fluids,  3. Follow Up: Please followup with your primary doctor in 2-3 days for discussion of your diagnoses and further evaluation after today's visit; if you do not have a primary care doctor use the resource guide provided to find one; Please return to the ER for worsening pain, associated SOB, persistent vomiting or other concerns

## 2017-01-31 NOTE — ED Notes (Signed)
Urine preg added on with main lab Maudie Mercury)

## 2017-02-07 ENCOUNTER — Ambulatory Visit: Payer: Medicaid Other | Admitting: Student in an Organized Health Care Education/Training Program

## 2017-04-03 ENCOUNTER — Encounter (HOSPITAL_COMMUNITY): Payer: Self-pay | Admitting: Emergency Medicine

## 2017-04-03 ENCOUNTER — Ambulatory Visit (HOSPITAL_COMMUNITY)
Admission: EM | Admit: 2017-04-03 | Discharge: 2017-04-03 | Disposition: A | Discharge status: Home / Self Care | Payer: Medicaid Other | Attending: Internal Medicine | Admitting: Internal Medicine

## 2017-04-03 DIAGNOSIS — J069 Acute upper respiratory infection, unspecified: Secondary | ICD-10-CM | POA: Diagnosis not present

## 2017-04-03 DIAGNOSIS — H9203 Otalgia, bilateral: Secondary | ICD-10-CM

## 2017-04-03 MED ORDER — CETIRIZINE HCL 10 MG PO TABS: 10.0000 mg | ORAL_TABLET | Freq: Every day | ORAL | 0 refills | Status: DC

## 2017-04-03 MED ORDER — FLUTICASONE PROPIONATE 50 MCG/ACT NA SUSP: 1.0000 | Freq: Every day | NASAL | 2 refills | Status: DC

## 2017-04-03 NOTE — ED Provider Notes (Signed)
[place]    CSN: [id number] Arrival date & time: 04/03/17  1527     History   Chief Complaint Chief Complaint  Patient presents with  . Otalgia    HPI Kimberly Bradley is a 27 y.o. female.   Kai presents with complaints of bilateral ear pain, right greater than left, which has been ongoing for approximately 4-5 days. She feels that they "pop". She has runny nose and congestion, mild cough and sore throat. Without fevers. She has tried taking ibuprofen and hydrocodone which have not helped with pain. Her daughter has an ear infection. Without gi/gu complaints. Rates pain 8/10. Has not taken any ibuprofen today.   ROS per HPI.       Past Medical History:  Diagnosis Date  . Headache(784.0)   . SVD (spontaneous vaginal delivery)    x 3    Patient Active Problem List   Diagnosis Date Noted  . AP (abdominal pain) 08/11/2015  . Headache 04/04/2015  . Abdominal pain, left lower quadrant 07/13/2013  . Sterilization 05/03/2012  . Small for gestational age 16/16/2013  . Supervision of normal subsequent pregnancy 11/17/2011  . Loss of hearing 05/18/2011  . Abdominal cramping 09/18/2010  . UMBILICAL HERNIA 84/69/6295  . 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 ABNORMAL PAP SMEAR, LGSIL 12/27/2007  . ABORTION, SPONTANEOUS 10/03/2007  . OVARIAN CYST, RIGHT 09/08/2007  . TOBACCO USE, QUIT 05/26/2006    Past Surgical History:  Procedure Laterality Date  . HERNIA REPAIR  2010  . LAPAROSCOPIC TUBAL LIGATION  05/03/2012   Procedure: LAPAROSCOPIC TUBAL LIGATION;  Surgeon: Lavonia Drafts, MD;  Location: [facility];  Service: Gynecology;  Laterality: N/A;    OB History    Gravida Para Term Preterm AB Living   4 3 3   1 3    SAB TAB Ectopic Multiple Live Births   1       3       Home Medications    Prior to Admission medications   Medication Sig Start Date End Date Taking? Authorizing Provider  acetaminophen (TYLENOL) 325 MG tablet Take 650 mg by mouth  every 6 (six) hours as needed for headache.    [provider]  cetirizine (ZYRTEC) 10 MG tablet Take 1 tablet (10 mg total) by mouth daily. 04/03/17   Zigmund Gottron, NP  fluticasone (FLONASE) 50 MCG/ACT nasal spray Place 1 spray into both nostrils daily. 04/03/17   Zigmund Gottron, NP  HYDROcodone-acetaminophen (NORCO/VICODIN) 5-325 MG tablet Take 1 tablet by mouth every 8 (eight) hours as needed for severe pain. 10/08/16   Kinnie Feil, MD  ibuprofen (ADVIL,MOTRIN) 800 MG tablet Take 1 tablet (800 mg total) by mouth every 8 (eight) hours as needed. 08/05/16   Dalia Heading, PA-C    Family History History reviewed. No pertinent family history.  Social History Social History   Tobacco Use  . Smoking status: Former Smoker    Packs/day: 1.00    Years: 6.00    Pack years: 6.00    Types: Cigarettes    Last attempt to quit: 11/12/2011    Years since quitting: 5.3  . Smokeless tobacco: Never Used  Substance Use Topics  . Alcohol use: Yes    Comment: socially  . Drug use: No     Allergies   Tramadol hcl and Codeine   Review of Systems Review of Systems   Physical Exam Triage Vital Signs ED Triage Vitals  Enc Vitals Group     BP  04/03/17 1645 105/70     Pulse Rate 04/03/17 1645 64     Resp 04/03/17 1645 18     Temp 04/03/17 1645 97.8 F (36.6 C)     Temp Source 04/03/17 1645 Oral     SpO2 04/03/17 1645 100 %     Weight --      Height --      Head Circumference --      Peak Flow --      Pain Score 04/03/17 1646 8     Pain Loc --      Pain Edu? --      Excl. in [place]? --    No data found.  Updated Vital Signs BP 105/70 (BP Location: Left Arm)   Pulse 64   Temp 97.8 F (36.6 C) (Oral)   Resp 18   LMP 03/22/2017   SpO2 100%   Visual Acuity Right Eye Distance:   Left Eye Distance:   Bilateral Distance:    Right Eye Near:   Left Eye Near:    Bilateral Near:     Physical Exam  Constitutional: She is oriented to person, place, and time. She  appears well-developed and well-nourished. No distress.  HENT:  Head: Normocephalic and atraumatic.  Right Ear: Tympanic membrane, external ear and ear canal normal.  Left Ear: Tympanic membrane, external ear and ear canal normal.  Nose: Nose normal.  Mouth/Throat: Uvula is midline, oropharynx is clear and moist and mucous membranes are normal. No tonsillar exudate.  Mild redness to left TM without bulging; right TM WNL  Eyes: Conjunctivae and EOM are normal. Pupils are equal, round, and reactive to light.  Cardiovascular: Normal rate, regular rhythm and normal heart sounds.  Pulmonary/Chest: Effort normal and breath sounds normal.  Neurological: She is alert and oriented to person, place, and time.  Skin: Skin is warm and dry.     UC Treatments / Results  Labs (all labs ordered are listed, but only abnormal results are displayed) Labs Reviewed - No data to display  EKG  EKG Interpretation None       Radiology No results found.  Procedures Procedures (including critical care time)  Medications Ordered in UC Medications - No data to display   Initial Impression / Assessment and Plan / UC Course  I have reviewed the triage vital signs and the nursing notes.  Pertinent labs & imaging results that were available during my care of the patient were reviewed by me and considered in my medical decision making (see chart for details).     Without signs of OM today on exam. Congestion and URI symptoms present, likely contributing to ear pain. daily flonase and zyrtec to help with symptoms. Push fluids. Ibuprofen as needed. If symptoms worsen or do not improve in the next week to return to be seen or to follow up with PCP.  Patient verbalized understanding and agreeable to plan.    Final Clinical Impressions(s) / UC Diagnoses   Final diagnoses:  Viral upper respiratory tract infection  Otalgia of both ears    ED Discharge Orders        Ordered    fluticasone (FLONASE) 50  MCG/ACT nasal spray  Daily     04/03/17 1720    cetirizine (ZYRTEC) 10 MG tablet  Daily     04/03/17 1720       Controlled Substance Prescriptions [place] Controlled Substance Registry consulted? Not Applicable   Zigmund Gottron, NP 04/03/17 1727

## 2017-04-03 NOTE — Discharge Instructions (Signed)
Push fluids to ensure adequate hydration and keep secretions thin.  Tylenol and/or ibuprofen as needed for pain or fevers.  Daily flonase and zyrtec may help ear pressure.  If symptoms worsen or do not improve in the next week to return to be seen or to follow up with PCP.

## 2017-04-03 NOTE — ED Triage Notes (Signed)
PT C/O: constant bilateral ear pain associated w/HA   ONSET: 4 days  DENIES: fevers  TAKING MEDS: none today  A&O x4... NAD... Ambulatory

## 2017-05-10 ENCOUNTER — Emergency Department (HOSPITAL_COMMUNITY)
Admission: EM | Admit: 2017-05-10 | Discharge: 2017-05-10 | Disposition: A | Discharge status: Home / Self Care | Payer: Medicaid Other | Attending: Emergency Medicine | Admitting: Emergency Medicine

## 2017-05-10 ENCOUNTER — Encounter (HOSPITAL_COMMUNITY): Payer: Self-pay | Admitting: Emergency Medicine

## 2017-05-10 DIAGNOSIS — J111 Influenza due to unidentified influenza virus with other respiratory manifestations: Secondary | ICD-10-CM | POA: Insufficient documentation

## 2017-05-10 DIAGNOSIS — R110 Nausea: Secondary | ICD-10-CM | POA: Insufficient documentation

## 2017-05-10 DIAGNOSIS — R05 Cough: Secondary | ICD-10-CM | POA: Insufficient documentation

## 2017-05-10 DIAGNOSIS — F17210 Nicotine dependence, cigarettes, uncomplicated: Secondary | ICD-10-CM | POA: Diagnosis not present

## 2017-05-10 DIAGNOSIS — Z79899 Other long term (current) drug therapy: Secondary | ICD-10-CM | POA: Insufficient documentation

## 2017-05-10 DIAGNOSIS — M791 Myalgia, unspecified site: Secondary | ICD-10-CM | POA: Diagnosis present

## 2017-05-10 DIAGNOSIS — R6889 Other general symptoms and signs: Secondary | ICD-10-CM

## 2017-05-10 LAB — I-STAT BETA HCG BLOOD, ED (MC, WL, AP ONLY): I-stat hCG, quantitative: <5 m[IU]/mL (ref <5)

## 2017-05-10 MED ORDER — KETOROLAC TROMETHAMINE 30 MG/ML IJ SOLN
30.0000 mg | Freq: Once | INTRAMUSCULAR | Status: AC
  Administered 2017-05-10: 30 mg via INTRAVENOUS
  Filled 2017-05-10: qty 1

## 2017-05-10 MED ORDER — SODIUM CHLORIDE 0.9 % IV BOLUS (SEPSIS)
1000.0000 mL | Freq: Once | INTRAVENOUS | Status: AC
  Administered 2017-05-10: 1000 mL via INTRAVENOUS

## 2017-05-10 MED ORDER — OSELTAMIVIR PHOSPHATE 75 MG PO CAPS: 75.0000 mg | ORAL_CAPSULE | Freq: Two times a day (BID) | ORAL | 0 refills | Status: DC

## 2017-05-10 NOTE — ED Triage Notes (Signed)
Patient complains of generalized body aches that started at 0100 this morning. Denies being around any that was sick. Afebrile, nonproductive cough, alert and oriented and in no apparent distress.

## 2017-05-10 NOTE — ED Provider Notes (Signed)
[facility] EMERGENCY DEPARTMENT Provider Note   CSN: [id number] Arrival date & time: 05/10/17  0736     History   Chief Complaint Chief Complaint  Patient presents with  . Generalized Body Aches    HPI Kimberly Bradley is a 27 y.o. female.  HPI Patient resents with body aches myalgia and cough.  Aches all over.  Began 2 days ago but worse last night.  No sick contacts.  Had some nausea without vomiting or diarrhea.  Denies possibility of pregnancy. Past Medical History:  Diagnosis Date  . Headache(784.0)   . SVD (spontaneous vaginal delivery)    x 3    Patient Active Problem List   Diagnosis Date Noted  . AP (abdominal pain) 08/11/2015  . Headache 04/04/2015  . Abdominal pain, left lower quadrant 07/13/2013  . Sterilization 05/03/2012  . Small for gestational age 32/16/2013  . Supervision of normal subsequent pregnancy 11/17/2011  . Loss of hearing 05/18/2011  . Abdominal cramping 09/18/2010  . UMBILICAL HERNIA 09/19/7626  . 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 ABNORMAL PAP SMEAR, LGSIL 12/27/2007  . ABORTION, SPONTANEOUS 10/03/2007  . OVARIAN CYST, RIGHT 09/08/2007  . TOBACCO USE, QUIT 05/26/2006    Past Surgical History:  Procedure Laterality Date  . HERNIA REPAIR  2010  . LAPAROSCOPIC TUBAL LIGATION  05/03/2012   Procedure: LAPAROSCOPIC TUBAL LIGATION;  Surgeon: Lavonia Drafts, MD;  Location: [facility];  Service: Gynecology;  Laterality: N/A;    OB History    Gravida Para Term Preterm AB Living   4 3 3   1 3    SAB TAB Ectopic Multiple Live Births   1       3       Home Medications    Prior to Admission medications   Medication Sig Start Date End Date Taking? Authorizing Provider  acetaminophen (TYLENOL) 325 MG tablet Take 650 mg by mouth every 6 (six) hours as needed for headache.    [provider]  cetirizine (ZYRTEC) 10 MG tablet Take 1 tablet (10 mg total) by mouth daily. 04/03/17   Zigmund Gottron, NP  fluticasone  (FLONASE) 50 MCG/ACT nasal spray Place 1 spray into both nostrils daily. 04/03/17   Zigmund Gottron, NP  HYDROcodone-acetaminophen (NORCO/VICODIN) 5-325 MG tablet Take 1 tablet by mouth every 8 (eight) hours as needed for severe pain. 10/08/16   Kinnie Feil, MD  ibuprofen (ADVIL,MOTRIN) 800 MG tablet Take 1 tablet (800 mg total) by mouth every 8 (eight) hours as needed. 08/05/16   Lawyer, Harrell Gave, PA-C  oseltamivir (TAMIFLU) 75 MG capsule Take 1 capsule (75 mg total) by mouth every 12 (twelve) hours. 05/10/17   Davonna Belling, MD    Family History No family history on file.  Social History Social History   Tobacco Use  . Smoking status: Current Every Day Smoker    Packs/day: 0.50    Years: 6.00    Pack years: 3.00    Types: Cigarettes    Last attempt to quit: 11/12/2011    Years since quitting: 5.4  . Smokeless tobacco: Never Used  Substance Use Topics  . Alcohol use: Yes    Comment: socially  . Drug use: No     Allergies   Tramadol hcl and Codeine   Review of Systems Review of Systems  Constitutional: Positive for appetite change and chills.  HENT: Negative for congestion.   Respiratory: Positive for cough.   Cardiovascular: Negative for chest pain.  Gastrointestinal: Negative for  abdominal pain.  Musculoskeletal: Positive for myalgias.  Skin: Negative for pallor.  Neurological: Negative for syncope.  Hematological: Negative for adenopathy.  Psychiatric/Behavioral: Negative for confusion.     Physical Exam Updated Vital Signs BP 106/68   Pulse (!) 57   Temp 98.6 F (37 C) (Oral)   Resp 16   LMP 04/28/2017 (Exact Date)   SpO2 100%   Physical Exam  Constitutional: She appears well-developed.  HENT:  Head: Atraumatic.  Mouth/Throat: No oropharyngeal exudate.  Neck: Neck supple.  Cardiovascular:  Mild tachycardia  Pulmonary/Chest: Effort normal.  Abdominal: There is no tenderness.  Musculoskeletal: She exhibits no edema.  Neurological: She is  alert.  Skin: Skin is warm. Capillary refill takes less than 2 seconds.  Psychiatric: She has a normal mood and affect.     ED Treatments / Results  Labs (all labs ordered are listed, but only abnormal results are displayed) Labs Reviewed  I-STAT BETA HCG BLOOD, ED ([facility], [facility], [facility] ONLY)    EKG  EKG Interpretation None       Radiology No results found.  Procedures Procedures (including critical care time)  Medications Ordered in ED Medications  sodium chloride 0.9 % bolus 1,000 mL (0 mLs Intravenous Stopped 05/10/17 1002)  ketorolac (TORADOL) 30 MG/ML injection 30 mg (30 mg Intravenous Given 05/10/17 0902)     Initial Impression / Assessment and Plan / ED Course  I have reviewed the triage vital signs and the nursing notes.  Pertinent labs & imaging results that were available during my care of the patient were reviewed by me and considered in my medical decision making (see chart for details).     Patient with myalgia.  Flulike illness.  Given IV fluid and Toradol for symptomatic relief.  Feels better.  Will discharge home with Tamiflu.  Final Clinical Impressions(s) / ED Diagnoses   Final diagnoses:  Flu-like symptoms    ED Discharge Orders        Ordered    oseltamivir (TAMIFLU) 75 MG capsule  Every 12 hours     05/10/17 1149       Davonna Belling, MD 05/10/17 1600

## 2017-05-29 IMAGING — CT CT HEAD W/O CM
1 of 2 series · 15 of 30 positions shown, 19 images · non-contrast
Comparison: None.

CLINICAL DATA: Subacute onset of migraine headache. Vomiting and
nausea. Initial encounter.

EXAM:
CT HEAD WITHOUT CONTRAST
TECHNIQUE: Contiguous axial images were obtained from the base of the skull
through the vertex without intravenous contrast.

[Series 3: head 2.0 h70h · axial · 0.45mm/px · z∈[+615,+757]mm · 15 of 79 slices shown, 19 images]
[im 4/79  brain]
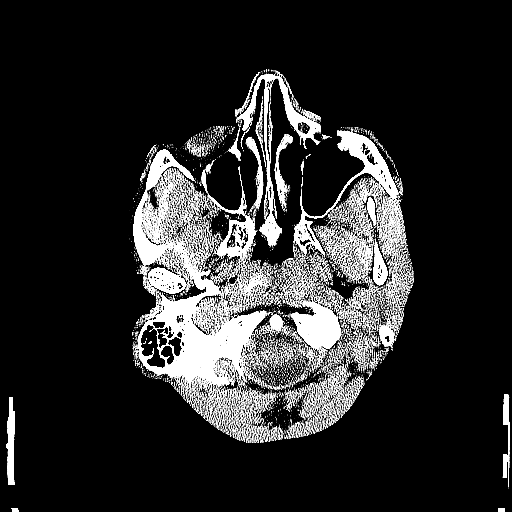
[im 4/79  bone]
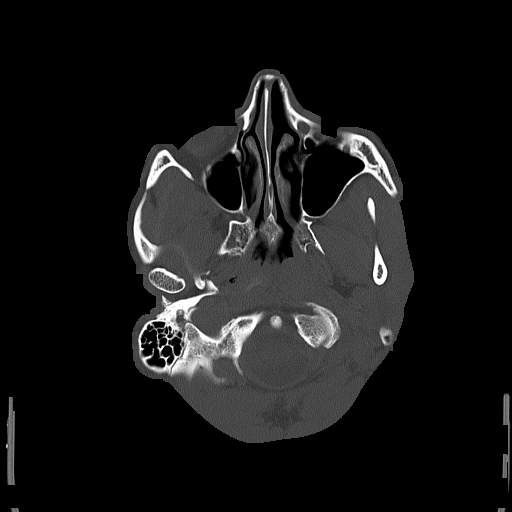
[im 8/79  brain]
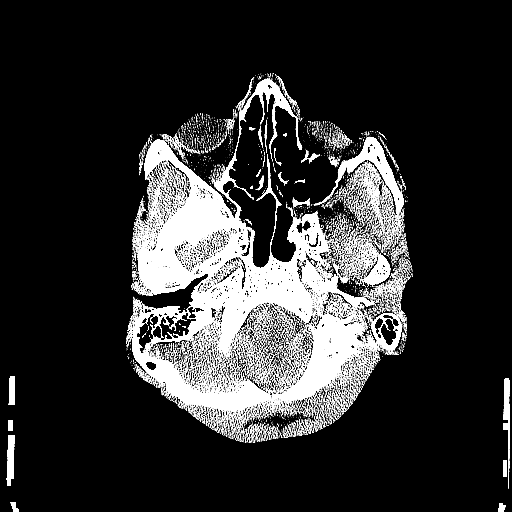
[im 16/79  brain]
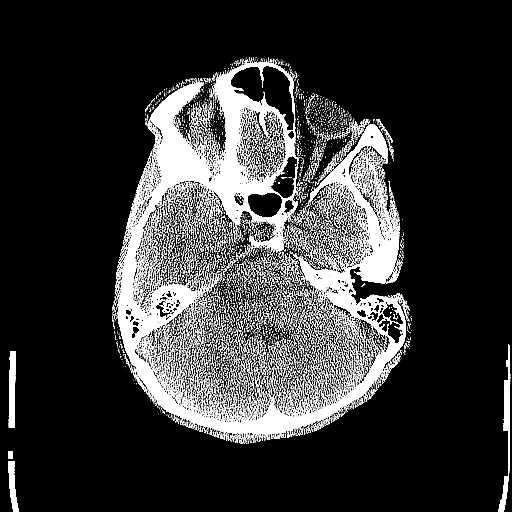
[im 20/79  brain]
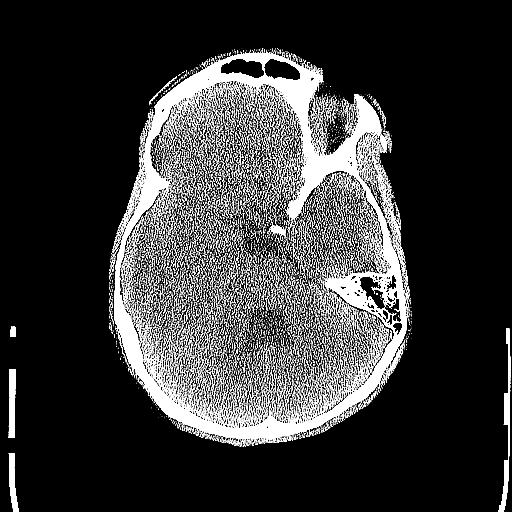
[im 24/79  brain]
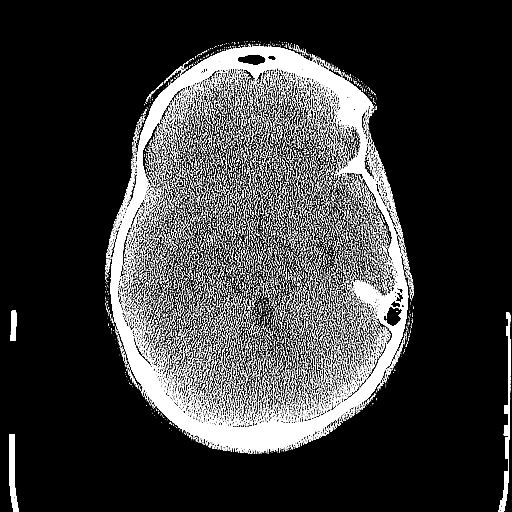
[im 24/79  bone]
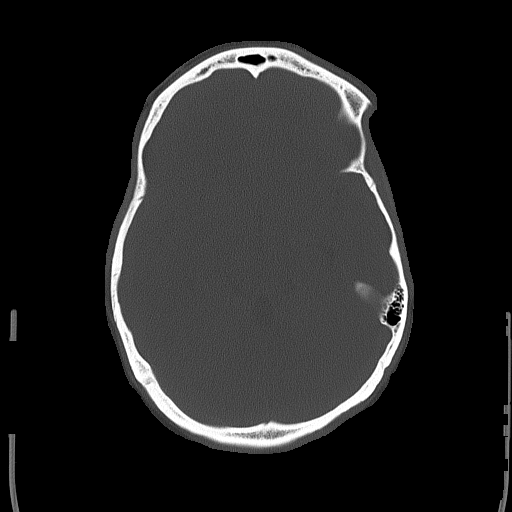
[im 28/79  brain]
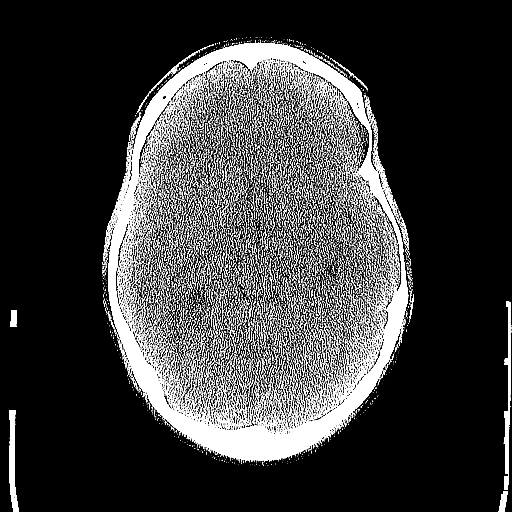
[im 36/79  brain]
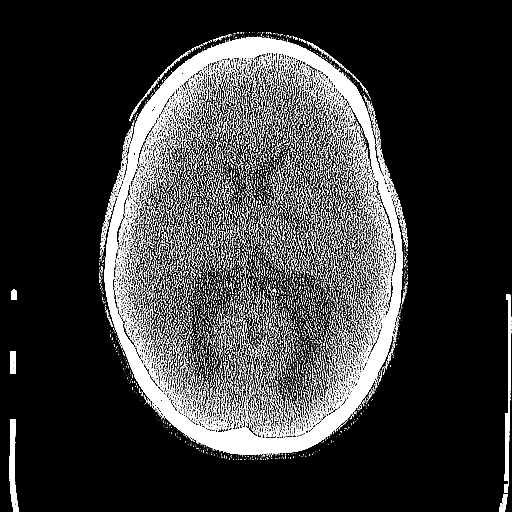
[im 40/79  brain]
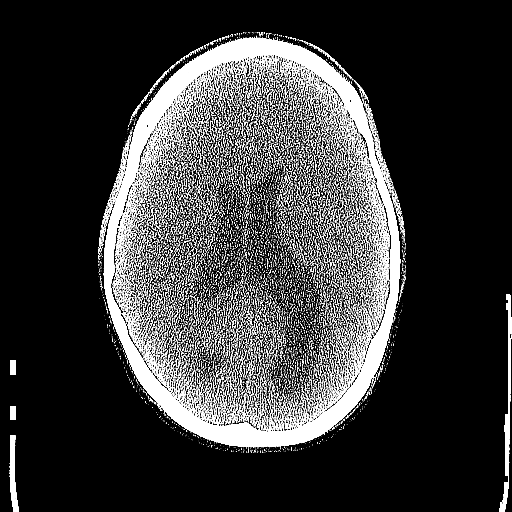
[im 43/79  brain]
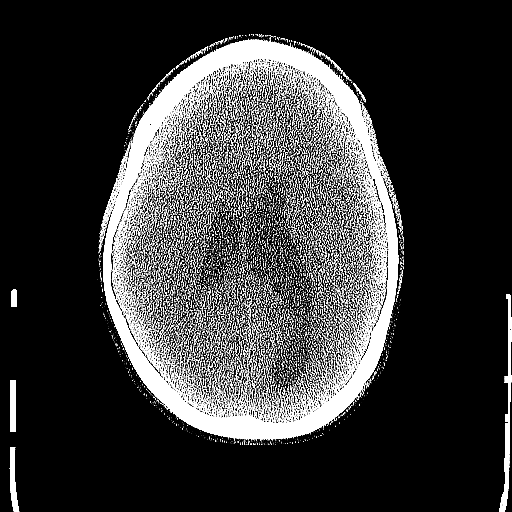
[im 43/79  bone]
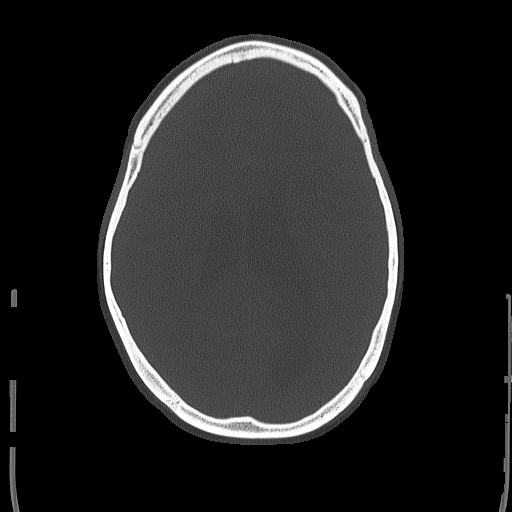
[im 51/79  brain]
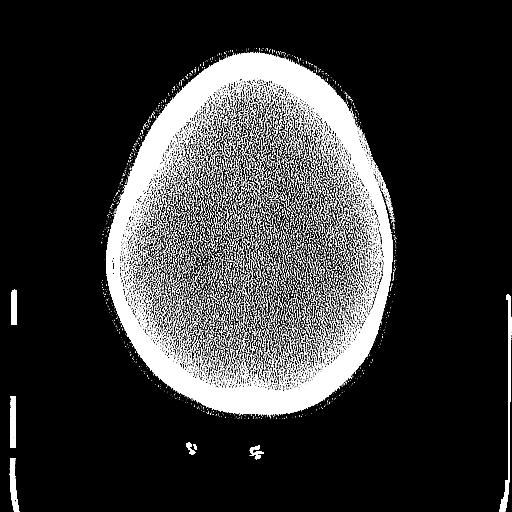
[im 55/79  brain]
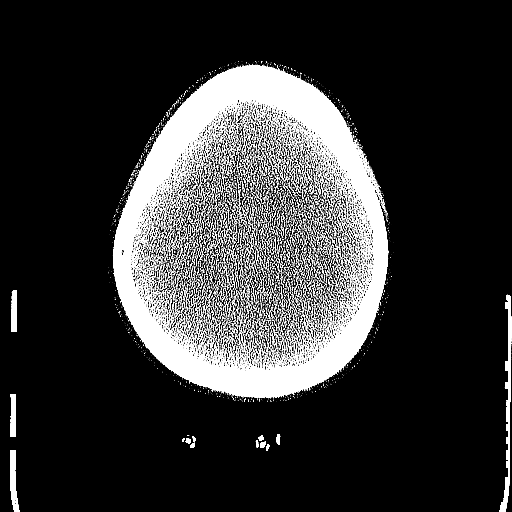
[im 59/79  brain]
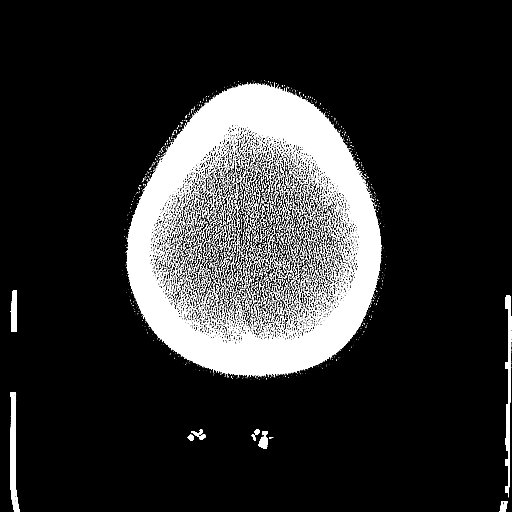
[im 63/79  brain]
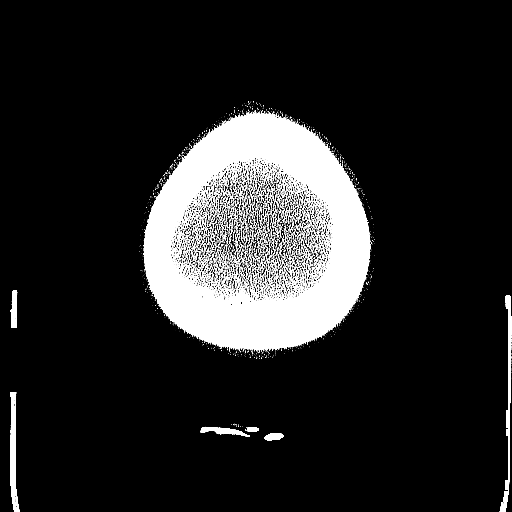
[im 63/79  bone]
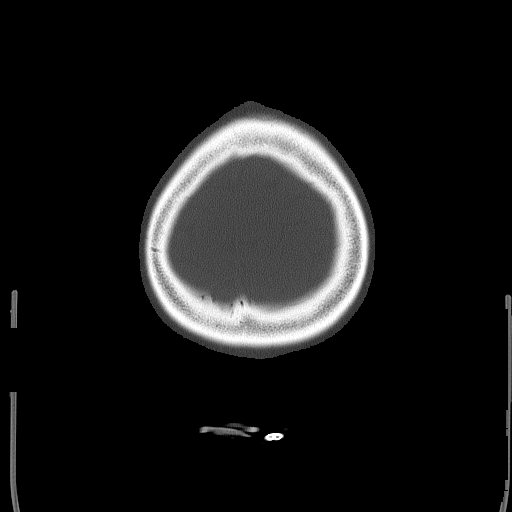
[im 71/79  brain]
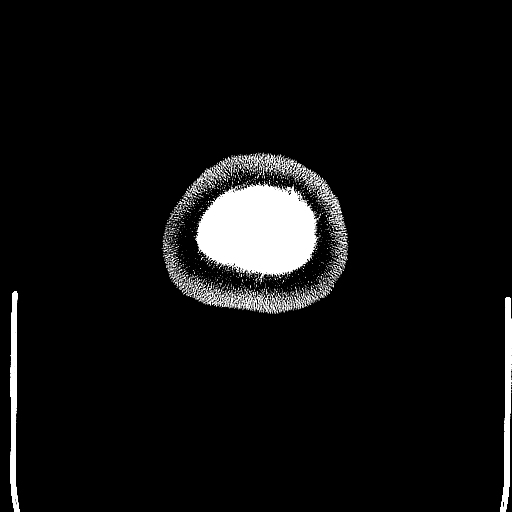
[im 75/79  brain]
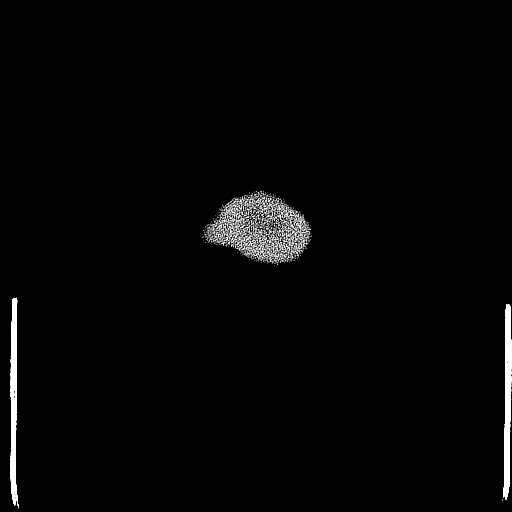

[15 of 30 positions shown; findings below may reference images not displayed]

FINDINGS: There is no evidence of acute infarction, mass lesion, or intra- or
extra-axial hemorrhage on CT.

Prominence of the occipital horns of the lateral ventricles,
particularly on the left, is thought to reflect a normal variant, as
the third ventricle is relatively decompressed and the fourth
ventricle has a somewhat unusual configuration.

The posterior fossa, including the cerebellum, brainstem and fourth
ventricle, is within normal limits. The third and lateral
ventricles, and basal ganglia are unremarkable in appearance. The
cerebral hemispheres are symmetric in appearance, with normal
gray-white differentiation. No mass effect or midline shift is seen.

There is no evidence of fracture; visualized osseous structures are
unremarkable in appearance. The visualized portions of the orbits
are within normal limits. The paranasal sinuses and mastoid air
cells are well-aerated. No significant soft tissue abnormalities are
seen.
IMPRESSION: 1. No acute intracranial pathology seen on CT.
2. Prominence of the occipital horns of the lateral ventricles,
particularly on the left, is thought to reflect a normal variant.

## 2017-09-13 ENCOUNTER — Encounter (HOSPITAL_COMMUNITY): Payer: Self-pay

## 2017-09-13 ENCOUNTER — Other Ambulatory Visit: Payer: Self-pay

## 2017-09-13 ENCOUNTER — Emergency Department (HOSPITAL_COMMUNITY)
Admission: EM | Admit: 2017-09-13 | Discharge: 2017-09-14 | Disposition: A | Discharge status: Home / Self Care | Payer: Medicaid Other | Attending: Emergency Medicine | Admitting: Emergency Medicine

## 2017-09-13 DIAGNOSIS — G43909 Migraine, unspecified, not intractable, without status migrainosus: Secondary | ICD-10-CM | POA: Insufficient documentation

## 2017-09-13 DIAGNOSIS — G43009 Migraine without aura, not intractable, without status migrainosus: Secondary | ICD-10-CM

## 2017-09-13 DIAGNOSIS — F17210 Nicotine dependence, cigarettes, uncomplicated: Secondary | ICD-10-CM | POA: Insufficient documentation

## 2017-09-13 NOTE — ED Triage Notes (Signed)
Onset 4 days headache, starting right forehead radiating to right side of head, nausea, photophobia.  Pt has taken Tylenol, Advil, Excedrin migraine with no relief.

## 2017-09-14 ENCOUNTER — Encounter: Payer: Self-pay | Admitting: Neurology

## 2017-09-14 MED ORDER — KETOROLAC TROMETHAMINE 30 MG/ML IJ SOLN
30.0000 mg | Freq: Once | INTRAMUSCULAR | Status: AC
  Administered 2017-09-14: 30 mg via INTRAVENOUS
  Filled 2017-09-14: qty 1

## 2017-09-14 MED ORDER — METOCLOPRAMIDE HCL 5 MG/ML IJ SOLN
10.0000 mg | Freq: Once | INTRAMUSCULAR | Status: AC
  Administered 2017-09-14: 10 mg via INTRAVENOUS
  Filled 2017-09-14: qty 2

## 2017-09-14 MED ORDER — DIPHENHYDRAMINE HCL 50 MG/ML IJ SOLN
12.5000 mg | Freq: Once | INTRAMUSCULAR | Status: AC
  Administered 2017-09-14: 12.5 mg via INTRAVENOUS
  Filled 2017-09-14: qty 1

## 2017-09-14 MED ORDER — DEXAMETHASONE SODIUM PHOSPHATE 10 MG/ML IJ SOLN
10.0000 mg | Freq: Once | INTRAMUSCULAR | Status: AC
  Administered 2017-09-14: 10 mg via INTRAVENOUS
  Filled 2017-09-14: qty 1

## 2017-09-14 NOTE — ED Provider Notes (Signed)
[facility]ERGENCY DEPARTMENT Provider Note   CSN: [id number] Arrival date & time: 09/13/17  2156     History   Chief Complaint Chief Complaint  Patient presents with  . Headache    HPI Kimberly Bradley is a 27 y.o. female.  Patient with a history of frequent migraines presents with headache typical of previous migraine. It is associated with nausea and vomiting and photophobia. No fever. Headache is located in the right frontal area, is described as throbbing and nonradiating. She has been taking Exedrine Migraine without significant relief.   The history is provided by the patient. No language interpreter was used.  Headache   Associated symptoms include nausea and vomiting. Pertinent negatives include no fever.    Past Medical History:  Diagnosis Date  . Headache(784.0)   . SVD (spontaneous vaginal delivery)    x 3    Patient Active Problem List   Diagnosis Date Noted  . AP (abdominal pain) 08/11/2015  . Headache 04/04/2015  . Abdominal pain, left lower quadrant 07/13/2013  . Sterilization 05/03/2012  . Small for gestational age 70/16/2013  . Supervision of normal subsequent pregnancy 11/17/2011  . Loss of hearing 05/18/2011  . Abdominal cramping 09/18/2010  . UMBILICAL HERNIA 37/90/2409  . 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 ABNORMAL PAP SMEAR, LGSIL 12/27/2007  . ABORTION, SPONTANEOUS 10/03/2007  . OVARIAN CYST, RIGHT 09/08/2007  . TOBACCO USE, QUIT 05/26/2006    Past Surgical History:  Procedure Laterality Date  . HERNIA REPAIR  2010  . LAPAROSCOPIC TUBAL LIGATION  05/03/2012   Procedure: LAPAROSCOPIC TUBAL LIGATION;  Surgeon: Lavonia Drafts, MD;  Location: [facility];  Service: Gynecology;  Laterality: N/A;     OB History    Gravida  4   Para  3   Term  3   Preterm      AB  1   Living  3     SAB  1   TAB      Ectopic      Multiple      Live Births  3            Home Medications    Prior to Admission  medications   Medication Sig Start Date End Date Taking? Authorizing Provider  acetaminophen (TYLENOL) 325 MG tablet Take 650 mg by mouth every 6 (six) hours as needed for headache.   Yes [provider]  cetirizine (ZYRTEC) 10 MG tablet Take 1 tablet (10 mg total) by mouth daily. Patient not taking: Reported on 09/14/2017 04/03/17   Zigmund Gottron, NP  fluticasone (FLONASE) 50 MCG/ACT nasal spray Place 1 spray into both nostrils daily. Patient not taking: Reported on 09/14/2017 04/03/17   Augusto Gamble B, NP  HYDROcodone-acetaminophen (NORCO/VICODIN) 5-325 MG tablet Take 1 tablet by mouth every 8 (eight) hours as needed for severe pain. Patient not taking: Reported on 09/14/2017 10/08/16   Kinnie Feil, MD  ibuprofen (ADVIL,MOTRIN) 800 MG tablet Take 1 tablet (800 mg total) by mouth every 8 (eight) hours as needed. Patient not taking: Reported on 09/14/2017 08/05/16   Dalia Heading, PA-C  oseltamivir (TAMIFLU) 75 MG capsule Take 1 capsule (75 mg total) by mouth every 12 (twelve) hours. Patient not taking: Reported on 09/14/2017 05/10/17   Davonna Belling, MD    Family History History reviewed. No pertinent family history.  Social History Social History   Tobacco Use  . Smoking status: Current Every Day Smoker    Packs/day: 0.50  Years: 6.00    Pack years: 3.00    Types: Cigarettes    Last attempt to quit: 11/12/2011    Years since quitting: 5.8  . Smokeless tobacco: Never Used  Substance Use Topics  . Alcohol use: Yes    Comment: socially  . Drug use: No     Allergies   Tramadol hcl and Codeine   Review of Systems Review of Systems  Constitutional: Negative for chills and fever.  HENT: Negative.   Eyes: Positive for photophobia.  Respiratory: Negative.   Cardiovascular: Negative.   Gastrointestinal: Positive for nausea and vomiting.  Musculoskeletal: Negative.  Negative for myalgias.  Skin: Negative.   Neurological: Positive for headaches. Negative  for weakness and numbness.     Physical Exam Updated Vital Signs BP 115/77 (BP Location: Right Arm)   Pulse 67   Temp 98.5 F (36.9 C) (Oral)   Resp 18   Ht 5\' 7"  (1.702 m)   Wt 54.4 kg (120 lb)   LMP 09/09/2017   SpO2 100%   BMI 18.79 kg/m   Physical Exam  Constitutional: She is oriented to person, place, and time. She appears well-developed and well-nourished.  HENT:  Head: Normocephalic.  Eyes: Pupils are equal, round, and reactive to light.  Neck: Normal range of motion. Neck supple.  Cardiovascular: Normal rate and regular rhythm.  Pulmonary/Chest: Effort normal and breath sounds normal.  Abdominal: Soft. Bowel sounds are normal. There is no tenderness. There is no rebound and no guarding.  Musculoskeletal: Normal range of motion.  Neurological: She is alert and oriented to person, place, and time. She has normal strength and normal reflexes. No sensory deficit. She displays a negative Romberg sign. Coordination normal. GCS eye subscore is 4. GCS verbal subscore is 5. GCS motor subscore is 6.  Skin: Skin is warm and dry. No rash noted.  Psychiatric: She has a normal mood and affect.     ED Treatments / Results  Labs (all labs ordered are listed, but only abnormal results are displayed) Labs Reviewed - No data to display  EKG None  Radiology No results found.  Procedures Procedures (including critical care time)  Medications Ordered in ED Medications  metoCLOPramide (REGLAN) injection 10 mg (has no administration in time range)  diphenhydrAMINE (BENADRYL) injection 12.5 mg (has no administration in time range)  dexamethasone (DECADRON) injection 10 mg (has no administration in time range)     Initial Impression / Assessment and Plan / ED Course  I have reviewed the triage vital signs and the nursing notes.  Pertinent labs & imaging results that were available during my care of the patient were reviewed by me and considered in my medical decision making  (see chart for details).     Patient with headache c/w history of migraine. Normal, nonfocal neuro exam. Will provide headache cocktail and recheck.   Headache improved with cocktail with Decadron, but not resolved. Toradol added.  Headache is gone. Patient is appropriate for discharge home. Will provide neurology referral for migraine management as she is getting headaches on average of 3 per week.   Final Clinical Impressions(s) / ED Diagnoses   Final diagnoses:  None   1. Migraine Headache  ED Discharge Orders    None       Charlann Lange, PA-C 09/14/17 0355    Ripley Fraise, MD 09/14/17 [phone]

## 2017-09-16 ENCOUNTER — Other Ambulatory Visit: Payer: Self-pay

## 2017-09-16 ENCOUNTER — Encounter: Payer: Self-pay | Admitting: Internal Medicine

## 2017-09-16 ENCOUNTER — Ambulatory Visit (INDEPENDENT_AMBULATORY_CARE_PROVIDER_SITE_OTHER): Payer: Self-pay | Admitting: Internal Medicine

## 2017-09-16 VITALS — BP 108/82 | HR 73 | Temp 98.6°F | Ht 67.0 in | Wt 115.0 lb

## 2017-09-16 DIAGNOSIS — Z9851 Tubal ligation status: Secondary | ICD-10-CM

## 2017-09-16 DIAGNOSIS — G43701 Chronic migraine without aura, not intractable, with status migrainosus: Secondary | ICD-10-CM

## 2017-09-16 DIAGNOSIS — M25562 Pain in left knee: Secondary | ICD-10-CM

## 2017-09-16 MED ORDER — SUMATRIPTAN SUCCINATE 50 MG PO TABS: 50.0000 mg | ORAL_TABLET | ORAL | 0 refills | Status: DC | PRN

## 2017-09-16 NOTE — Progress Notes (Addendum)
[facility] Progress Note  Subjective:  Kimberly Bradley is a 27 y.o. female with history of migraines and abnormal pap smear who presents for ongoing headache and knee pain.  #Headache: - Was seen for this earlier in the week and had improvement with migraine cocktail and toradol - Still having symptoms; mainly having pain/pressure on right side of head (typical of previous migraines) - Was referred to Neurology in the ED given frequency of migraines/headaches of up to 3 times a week but does not have appointment until August - Has only tried excedrin for these. Typically has caffeine a couple times a day. Gets about 6 hours of sleep a night. - No vomiting or vision changes but is sensitive to light and nauseated at times - This episode has been ongoing for about 2 weeks per patient with brief resolutions in pain ROS: No falls, no fevers  #Knee pain: - Left knee became sore last week. It is worse with stairs. Did look swollen to her last week but now improved.  - No specific injury. No sensation of knee popping or giving out but does hear popping with bending from time to time. - Thinks she may be walking a little differently due to pain - She was seen by Dr. Erlinda Hong of Orthopedics last spring for left knee pain and was thought to have an effusion. No fluid able to be aspirated. Was considering MRI if no improvement at that time, as patient was having difficulty weight bearing.  #Fertility concern: - Had BTL in 2014 but desires pregnancy; would like referral to gynecology   Allergies  Allergen Reactions  . Tramadol Hcl Nausea And Vomiting    REACTION: nausea  . Codeine Rash    REACTION: Rash, pruritus    Social History   Tobacco Use  . Smoking status: Current Every Day Smoker    Packs/day: 0.50    Years: 6.00    Pack years: 3.00    Types: Cigarettes    Last attempt to quit: 11/12/2011    Years since quitting: 5.8  . Smokeless tobacco: Never Used  Substance Use  Topics  . Alcohol use: Yes    Comment: socially    Objective: Blood pressure 108/82, pulse 73, temperature 98.6 F (37 C), temperature source Oral, height 5\' 7"  (1.702 m), weight 115 lb (52.2 kg), last menstrual period 09/09/2017, SpO2 99 %. Body mass index is 18.01 kg/m. Constitutional: Thin female in NAD HENT: MMM, EOMI Musculoskeletal: Left and right knees appear symmetric. No joint line TTP. Discomfort with McMurray's and Thessaly's but no locking sensation or pain at joint line (pain behind knee). Discomfort with patellar grind test. Negative Apprehension test. Strength intact.  Neurological: AOx3, no focal deficits. CNII-XII intact.  Skin: Skin is warm and dry. No rash noted.  Psychiatric: Normal mood and affect.  Vitals reviewed  Assessment/Plan: Chronic migraine without aura with status migrainosus, not intractable - Symptoms typical of previous migraines for patient. Recommended trying abortive treatment of sumatriptan. Recommended increasing sleep and drinking plenty of fluids. - Asked patient to return next week to discuss prophylactic treatments, as is still a couple months until she can be seen by Neurology. - To go to ED if pain worsens.  Acute pain of left knee - Suspect patellofemoral pain as does not have joint line tenderness to suggest meniscal tear. Recommended supportive care with icing, ibuprofen prn, and quad strengthening exercises once pain improved (provided handout). 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  Surgical history of tubal ligation - Would like consultation on reversal. Referred to gynecology. Original procedure performed by Dr. Ihor Dow.   Follow-up next week to see if migraine resolved and to discuss starting prophylactic medication.  Olene Floss, MD [place], PGY-3

## 2017-09-16 NOTE — Patient Instructions (Signed)
Ms. Kimmons,  Try sumatriptan for migraine. Do not take more than 200 mg in a day. Second dose if needed can be taken 2 hours after first dose. Please return to next week to discuss medication for preventing migraines. Increasing sleep, how much water you drink, and limiting caffeine can help.  For your knee, I recommend icing and taking tylenol and ibuprofen as needed. When pain improved, try exercises to strengthen your quadriceps muscle to protect your knee.  You are due for a pap smear.   You should get a call within a week about gynecology referral regarding reversing tubal ligation.  Best, Dr. Ola Spurr

## 2017-09-19 ENCOUNTER — Encounter: Payer: Self-pay | Admitting: Internal Medicine

## 2017-09-19 DIAGNOSIS — M25562 Pain in left knee: Secondary | ICD-10-CM | POA: Insufficient documentation

## 2017-09-19 DIAGNOSIS — Z9851 Tubal ligation status: Secondary | ICD-10-CM | POA: Insufficient documentation

## 2017-09-19 DIAGNOSIS — G43701 Chronic migraine without aura, not intractable, with status migrainosus: Secondary | ICD-10-CM | POA: Insufficient documentation

## 2017-09-19 NOTE — Assessment & Plan Note (Signed)
-   Symptoms typical of previous migraines for patient. Recommended trying abortive treatment of sumatriptan. Recommended increasing sleep and drinking plenty of fluids. - Asked patient to return next week to discuss prophylactic treatments, as is still a couple months until she can be seen by Neurology. - To go to ED if pain worsens.

## 2017-09-19 NOTE — Assessment & Plan Note (Signed)
-   Suspect patellofemoral pain as does not have joint line tenderness to suggest meniscal tear. Recommended supportive care with icing, ibuprofen prn, and quad strengthening exercises once pain improved (provided handout). 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

## 2017-09-19 NOTE — Assessment & Plan Note (Signed)
-   Would like consultation on reversal. Referred to gynecology. Original procedure performed by Dr. Ihor Dow.

## 2017-09-22 ENCOUNTER — Ambulatory Visit: Payer: Self-pay | Admitting: Internal Medicine

## 2017-11-18 NOTE — Progress Notes (Deleted)
NEUROLOGY CONSULTATION NOTE  Kimberly Bradley MRN: [id number] DOB: 10/19/1990  Referring provider: *** Primary care provider: Everrett Coombe, MD  Reason for consult:  migraine  HISTORY OF PRESENT ILLNESS: Kimberly Bradley is a 27 year old ***-handed female who presents for migraines.  History supplemented by ED and primary care notes.  Onset:  *** Location:  *** Quality:  *** Intensity:  ***.  *** denies new headache, thunderclap headache or severe headache that wakes *** from sleep. Aura:  *** Prodrome:  *** Postdrome:  *** Associated symptoms:  ***.  *** denies associated unilateral numbness or weakness. Duration:  *** Frequency:  *** Frequency of abortive medication: *** Triggers:  *** Exacerbating factors:  *** Relieving factors:  *** Activity:  ***  Current NSAIDS:  Ibuprofen 800mg  Current analgesics:  Tylenol, hydrocodone (***) Current triptans:  Sumatriptan 50mg  Current ergotamine:  *** Current anti-emetic:  *** Current muscle relaxants:  *** Current anti-anxiolytic:  *** Current sleep aide:  *** Current Antihypertensive medications:  *** Current Antidepressant medications:  *** Current Anticonvulsant medications:  *** Current anti-CGRP:  *** Current Vitamins/Herbal/Supplements:  *** Current Antihistamines/Decongestants:  Zyrtec, Flonase Other therapy:  *** Other medication:  ***  Past NSAIDS:  *** Past analgesics:  *** Past abortive triptans:  *** Past abortive ergotamine:  *** Past muscle relaxants:  *** Past anti-emetic:  *** Past antihypertensive medications:  *** Past antidepressant medications:  *** Past anticonvulsant medications:  *** Past anti-CGRP:  *** Past vitamins/Herbal/Supplements:  *** Past antihistamines/decongestants:  *** Other past therapies:  ***  Caffeine:  *** Alcohol:  *** Smoker:  *** Diet:  *** Exercise:  *** Depression:  ***; Anxiety:  *** Other pain:  *** Sleep hygiene:  *** Family history of headache:  ***  She  had a CT of the head without contrast on 04/15/15 to evaluate migraine, which was personally reviewed and was unremarkable except for normal variant prominence of occipital horns (particularly on the left).  01/30/17 BMP:  Na 137, K 3.6, glucose 106, BUN 5, Cr 0.84.  PAST MEDICAL HISTORY: Past Medical History:  Diagnosis Date  . Headache(784.0)   . SVD (spontaneous vaginal delivery)    x 3    PAST SURGICAL HISTORY: Past Surgical History:  Procedure Laterality Date  . HERNIA REPAIR  2010  . LAPAROSCOPIC TUBAL LIGATION  05/03/2012   Procedure: LAPAROSCOPIC TUBAL LIGATION;  Surgeon: Lavonia Drafts, MD;  Location: [facility];  Service: Gynecology;  Laterality: N/A;    MEDICATIONS: Current Outpatient Medications on File Prior to Visit  Medication Sig Dispense Refill  . acetaminophen (TYLENOL) 325 MG tablet Take 650 mg by mouth every 6 (six) hours as needed for headache.    . cetirizine (ZYRTEC) 10 MG tablet Take 1 tablet (10 mg total) by mouth daily. (Patient not taking: Reported on 09/14/2017) 30 tablet 0  . fluticasone (FLONASE) 50 MCG/ACT nasal spray Place 1 spray into both nostrils daily. (Patient not taking: Reported on 09/14/2017) 16 g 2  . HYDROcodone-acetaminophen (NORCO/VICODIN) 5-325 MG tablet Take 1 tablet by mouth every 8 (eight) hours as needed for severe pain. (Patient not taking: Reported on 09/14/2017) 10 tablet 0  . ibuprofen (ADVIL,MOTRIN) 800 MG tablet Take 1 tablet (800 mg total) by mouth every 8 (eight) hours as needed. (Patient not taking: Reported on 09/14/2017) 21 tablet 0  . oseltamivir (TAMIFLU) 75 MG capsule Take 1 capsule (75 mg total) by mouth every 12 (twelve) hours. (Patient not taking: Reported on 09/14/2017) 10 capsule 0  .  SUMAtriptan (IMITREX) 50 MG tablet Take 1 tablet (50 mg total) by mouth every 2 (two) hours as needed for migraine. May repeat in 2 hours if headache persists or recurs. 10 tablet 0   No current facility-administered medications on file prior  to visit.     ALLERGIES: Allergies  Allergen Reactions  . Tramadol Hcl Nausea And Vomiting    REACTION: nausea  . Codeine Rash    REACTION: Rash, pruritus    FAMILY HISTORY: No family history on file. ***.  SOCIAL HISTORY: Social History   Socioeconomic History  . Marital status: Single    Spouse name: Not on file  . Number of children: Not on file  . Years of education: Not on file  . Highest education level: Not on file  Occupational History  . Not on file  Social Needs  . Financial resource strain: Not on file  . Food insecurity:    Worry: Not on file    Inability: Not on file  . Transportation needs:    Medical: Not on file    Non-medical: Not on file  Tobacco Use  . Smoking status: Current Every Day Smoker    Packs/day: 0.50    Years: 6.00    Pack years: 3.00    Types: Cigarettes    Last attempt to quit: 11/12/2011    Years since quitting: 6.0  . Smokeless tobacco: Never Used  Substance and Sexual Activity  . Alcohol use: Yes    Comment: socially  . Drug use: No  . Sexual activity: Not Currently    Birth control/protection: None  Lifestyle  . Physical activity:    Days per week: Not on file    Minutes per session: Not on file  . Stress: Not on file  Relationships  . Social connections:    Talks on phone: Not on file    Gets together: Not on file    Attends religious service: Not on file    Active member of club or organization: Not on file    Attends meetings of clubs or organizations: Not on file    Relationship status: Not on file  . Intimate partner violence:    Fear of current or ex partner: Not on file    Emotionally abused: Not on file    Physically abused: Not on file    Forced sexual activity: Not on file  Other Topics Concern  . Not on file  Social History Narrative  . Not on file    REVIEW OF SYSTEMS: Constitutional: No fevers, chills, or sweats, no generalized fatigue, change in appetite Eyes: No visual changes, double vision,  eye pain Ear, nose and throat: No hearing loss, ear pain, nasal congestion, sore throat Cardiovascular: No chest pain, palpitations Respiratory:  No shortness of breath at rest or with exertion, wheezes GastrointestinaI: No nausea, vomiting, diarrhea, abdominal pain, fecal incontinence Genitourinary:  No dysuria, urinary retention or frequency Musculoskeletal:  No neck pain, back pain Integumentary: No rash, pruritus, skin lesions Neurological: as above Psychiatric: No depression, insomnia, anxiety Endocrine: No palpitations, fatigue, diaphoresis, mood swings, change in appetite, change in weight, increased thirst Hematologic/Lymphatic:  No purpura, petechiae. Allergic/Immunologic: no itchy/runny eyes, nasal congestion, recent allergic reactions, rashes  PHYSICAL EXAM: There were no vitals filed for this visit. General: No acute distress.  Patient appears ***-groomed.  *** Head:  Normocephalic/atraumatic Eyes:  fundi examined but not visualized Neck: supple, no paraspinal tenderness, full range of motion Back: No paraspinal tenderness Heart: regular rate  and rhythm Lungs: Clear to auscultation bilaterally. Vascular: No carotid bruits. Neurological Exam: Mental status: alert and oriented to person, place, and time, recent and remote memory intact, fund of knowledge intact, attention and concentration intact, speech fluent and not dysarthric, language intact. Cranial nerves: CN I: not tested CN II: pupils equal, round and reactive to light, visual fields intact CN III, IV, VI:  full range of motion, no nystagmus, no ptosis CN V: facial sensation intact CN VII: upper and lower face symmetric CN VIII: hearing intact CN IX, X: gag intact, uvula midline CN XI: sternocleidomastoid and trapezius muscles intact CN XII: tongue midline Bulk & Tone: normal, no fasciculations. Motor:  5/5 throughout *** Sensation:  Pinprick *** temperature *** and vibration sensation intact.  ***. Deep  Tendon Reflexes:  2+ throughout, *** toes downgoing.  *** Finger to nose testing:  Without dysmetria.  *** Heel to shin:  Without dysmetria.  *** Gait:  Normal station and stride.  Able to turn and tandem walk. Romberg ***.  IMPRESSION: ***  PLAN: ***  Thank you for allowing me to take part in the care of this patient.  Metta Clines, DO  CC: ***

## 2017-11-21 ENCOUNTER — Ambulatory Visit: Payer: Self-pay | Admitting: Neurology

## 2018-06-19 ENCOUNTER — Other Ambulatory Visit: Payer: Self-pay

## 2018-06-19 ENCOUNTER — Ambulatory Visit (HOSPITAL_COMMUNITY)
Admission: EM | Admit: 2018-06-19 | Discharge: 2018-06-19 | Disposition: A | Discharge status: Home / Self Care | Payer: Self-pay | Attending: Internal Medicine | Admitting: Internal Medicine

## 2018-06-19 ENCOUNTER — Encounter (HOSPITAL_COMMUNITY): Payer: Self-pay

## 2018-06-19 DIAGNOSIS — G43111 Migraine with aura, intractable, with status migrainosus: Secondary | ICD-10-CM

## 2018-06-19 DIAGNOSIS — G43011 Migraine without aura, intractable, with status migrainosus: Secondary | ICD-10-CM

## 2018-06-19 MED ORDER — KETOROLAC TROMETHAMINE 60 MG/2ML IM SOLN
INTRAMUSCULAR | Status: AC
  Filled 2018-06-19: qty 2

## 2018-06-19 MED ORDER — FLUTICASONE PROPIONATE 50 MCG/ACT NA SUSP: 2.0000 | Freq: Every day | NASAL | 0 refills | Status: DC

## 2018-06-19 MED ORDER — KETOROLAC TROMETHAMINE 60 MG/2ML IM SOLN
60.0000 mg | Freq: Once | INTRAMUSCULAR | Status: AC
  Administered 2018-06-19: 60 mg via INTRAMUSCULAR

## 2018-06-19 MED ORDER — ONDANSETRON 4 MG PO TBDP
4.0000 mg | ORAL_TABLET | Freq: Once | ORAL | Status: AC
  Administered 2018-06-19: 4 mg via ORAL

## 2018-06-19 MED ORDER — ONDANSETRON 4 MG PO TBDP
ORAL_TABLET | ORAL | Status: AC
  Filled 2018-06-19: qty 1

## 2018-06-19 MED ORDER — RIZATRIPTAN BENZOATE 5 MG PO TBDP: 5.0000 mg | ORAL_TABLET | ORAL | 0 refills | Status: DC | PRN

## 2018-06-19 NOTE — ED Notes (Signed)
Patient verbalizes understanding of discharge instructions. Opportunity for questioning and answers were provided. Patient discharged from [facility] by PA.

## 2018-06-19 NOTE — ED Provider Notes (Signed)
[place]    CSN: [id number] Arrival date & time: 06/19/18  1008     History   Chief Complaint Chief Complaint  Patient presents with  . Migraine    HPI Kimberly Bradley is a 28 y.o. female.   Has been having HA on her forehead with photophobia and nausea x 7 days. She gets HA's 3 times a week for several months.  Usually takes Ibuprofen 600 mg helps get rid of the HA, but this time is not helping. She even tried Excedrin last week, and last night took one of her mother's oxycodone which did nothing for her, in fact  feels is worse today.  Has hx of HA's x 5 years but never been diagnosed with Migraines. Has not seen her PCP for HA's in a couple of years and only deals with it taking OTC meds. She has not vomited today, but did x 4 yesterday and x 3 the day before yesterday. LMP- on it now.  Pain level right now 8/10.      Past Medical History:  Diagnosis Date  . Headache(784.0)   . SVD (spontaneous vaginal delivery)    x 3    Patient Active Problem List   Diagnosis Date Noted  . Chronic migraine without aura with status migrainosus, not intractable 09/19/2017  . Acute pain of left knee 09/19/2017  . Surgical history of tubal ligation 09/19/2017  . AP (abdominal pain) 08/11/2015  . Headache 04/04/2015  . Abdominal pain, left lower quadrant 07/13/2013  . Sterilization 05/03/2012  . Small for gestational age 63/16/2013  . Supervision of normal subsequent pregnancy 11/17/2011  . Loss of hearing 05/18/2011  . Abdominal cramping 09/18/2010  . UMBILICAL HERNIA 19/37/9024  . 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 ABNORMAL PAP SMEAR, LGSIL 12/27/2007  . ABORTION, SPONTANEOUS 10/03/2007  . OVARIAN CYST, RIGHT 09/08/2007  . TOBACCO USE, QUIT 05/26/2006    Past Surgical History:  Procedure Laterality Date  . HERNIA REPAIR  2010  . LAPAROSCOPIC TUBAL LIGATION  05/03/2012   Procedure: LAPAROSCOPIC TUBAL LIGATION;  Surgeon: Lavonia Drafts, MD;  Location: [facility];  Service: Gynecology;  Laterality: N/A;    OB History    Gravida  4   Para  3   Term  3   Preterm      AB  1   Living  3     SAB  1   TAB      Ectopic      Multiple      Live Births  3            Home Medications    Prior to Admission medications   Medication Sig Start Date End Date Taking? Authorizing Provider  ibuprofen (ADVIL,MOTRIN) 200 MG tablet Take 200 mg by mouth every 6 (six) hours as needed for headache. 3 prn headaches   Yes [provider]  fluticasone (FLONASE) 50 MCG/ACT nasal spray Place 2 sprays into both nostrils daily for 7 days. Then as needed for sinus pain and inflammation 06/19/18 06/26/18  Rodriguez-Southworth, Sunday Spillers, PA-C  rizatriptan (MAXALT-MLT) 5 MG disintegrating tablet Take 1 tablet (5 mg total) by mouth as needed for migraine. May repeat in 2 hours if needed 06/19/18   Rodriguez-Southworth, Sunday Spillers, PA-C    Family History Family History  Problem Relation Age of Onset  . Cancer Mother     Social History Social History   Tobacco Use  . Smoking status: Current Every Day Smoker  Packs/day: 0.50    Years: 6.00    Pack years: 3.00    Types: Cigarettes    Last attempt to quit: 11/12/2011    Years since quitting: 6.6  . Smokeless tobacco: Never Used  Substance Use Topics  . Alcohol use: Yes    Comment: socially  . Drug use: No     Allergies   Tramadol hcl and Codeine   Review of Systems Review of Systems  Constitutional: Negative for chills, diaphoresis and fever.  HENT: Negative for congestion, ear discharge, ear pain, postnasal drip, rhinorrhea, sneezing and sore throat.   Eyes: Positive for photophobia. Negative for discharge and visual disturbance.  Respiratory: Negative for cough.   Gastrointestinal: Positive for nausea and vomiting. Negative for abdominal pain.  Musculoskeletal: Negative for myalgias.  Skin: Negative for rash.  Neurological: Positive for dizziness and headaches. Negative for  syncope, speech difficulty, weakness and numbness.  Hematological: Negative for adenopathy.   Physical Exam Triage Vital Signs ED Triage Vitals  Enc Vitals Group     BP 06/19/18 1028 107/73     Pulse Rate 06/19/18 1028 62     Resp 06/19/18 1028 16     Temp 06/19/18 1028 98.1 F (36.7 C)     Temp Source 06/19/18 1028 Oral     SpO2 06/19/18 1028 98 %     Weight 06/19/18 1029 115 lb (52.2 kg)     Height --      Head Circumference --      Peak Flow --      Pain Score 06/19/18 1029 8     Pain Loc --      Pain Edu? --      Excl. in [place]? --    No data found.  Updated Vital Signs BP 107/73 (BP Location: Left Arm)   Pulse 62   Temp 98.1 F (36.7 C) (Oral)   Resp 16   Wt 115 lb (52.2 kg)   LMP 06/19/2018   SpO2 98%   BMI 18.01 kg/m   Visual Acuity Right Eye Distance:   Left Eye Distance:   Bilateral Distance:    Right Eye Near:   Left Eye Near:    Bilateral Near:     Physical Exam Vitals signs and nursing note reviewed.  Constitutional:      General: He is not in acute distress. But is photophobic right now.     Appearance: He is well-developed and normal weight. He is not ill-appearing, toxic-appearing or diaphoretic.  HENT:     Head: Normocephalic HENT:     Nose: Nose normal. No congestion or rhinorrhea.     Comments: She has tenderness on all her sinuses   Eyes:     Extraocular Movements: Extraocular movements intact.     Pupils: Pupils are equal, round, and reactive to light.  Neck:     Musculoskeletal: Neck supple. No neck rigidity.     Meningeal: Brudzinski's sign absent.  Cardiovascular:     Rate and Rhythm: Normal rate and regular rhythm.     Heart sounds: No murmur.  Pulmonary:     Effort: Pulmonary effort is normal.     Breath sounds: Normal breath sounds. No wheezing, rhonchi or rales.  Abdominal:     General: Bowel sounds are normal.     Palpations: Abdomen is soft. There is no mass.     Tenderness: There is no abdominal tenderness. There is  no guarding.  Musculoskeletal: Normal range of motion.  Lymphadenopathy:  Cervical: No cervical adenopathy.  Skin:    General: Skin is warm and dry.  Neurological:     Mental Status: He is alert.     Cranial Nerves: No cranial nerve deficit or facial asymmetry.     Sensory: No sensory deficit.     Motor: No weakness.     Coordination: Romberg sign negative. Coordination normal.     Gait: Gait normal.     Deep Tendon Reflexes: Reflexes normal.     Comments: Normal Romberg, propioception, finger to nose, tandem gait.   Psychiatric:        Mood and Affect: Mood normal.        Speech: Speech normal.        Behavior: Behavior normal.     UC Treatments / Results  Labs (all labs ordered are listed, but only abnormal results are displayed) Labs Reviewed - No data to display  EKG None  Radiology No results found.  Procedures Procedures   Medications Ordered in UC Medications  ketorolac (TORADOL) injection 60 mg (60 mg Intramuscular Given 06/19/18 1103)  ondansetron (ZOFRAN-ODT) disintegrating tablet 4 mg (4 mg Oral Given 06/19/18 1102)    Initial Impression / Assessment and Plan / UC Course  I have reviewed the triage vital signs and the nursing notes. She was given Toradol 60 mg IM and Zofran  ODT 4 mg and after 20 minutes I checked on her and her pain was better, had decreased to 4/10. I prescribed her Maxalt SL to try and since her sinuses are also tender I prescribed her Flonase which she had used in the past.  Needs to do food diary and see if she sees a pattern with her HA's and bring it to her PCP.  Final Clinical Impressions(s) / UC Diagnoses   Final diagnoses:  Intractable migraine with aura with status migrainosus     Discharge Instructions     Your symptoms seemed to be migraine type HA, but is best you see a neurologist for confirmation. Since you are having so many headaches a week, you need to see your primary care provider and may need to see a  neurologist or he/she can decide if they want to do further work up or place you on preventive medication. Make a diary of what you eat and take this to your provider when you go for follow up, there are many foods that can trigger them.     ED Prescriptions    Medication Sig Dispense Auth. Provider   fluticasone (FLONASE) 50 MCG/ACT nasal spray Place 2 sprays into both nostrils daily for 7 days. Then as needed for sinus pain and inflammation 16 g Rodriguez-Southworth, Sunday Spillers, PA-C   rizatriptan (MAXALT-MLT) 5 MG disintegrating tablet Take 1 tablet (5 mg total) by mouth as needed for migraine. May repeat in 2 hours if needed 10 tablet Rodriguez-Southworth, Sunday Spillers, PA-C     Controlled Substance Prescriptions [place] Controlled Substance Registry consulted?    Shelby Mattocks, PA-C 06/19/18 1504

## 2018-06-19 NOTE — ED Triage Notes (Signed)
Pt cc migraine x 7 days. Pt states she has tried the OTC meds and they are not working.

## 2018-06-19 NOTE — Discharge Instructions (Signed)
Your symptoms seemed to be migraine type HA, but is best you see a neurologist for confirmation. Since you are having so many headaches a week, you need to see your primary care provider and may need to see a neurologist or he/she can decide if they want to do further work up or place you on preventive medication. Make a diary of what you eat and take this to your provider when you go for follow up, there are many foods that can trigger them.

## 2018-06-20 ENCOUNTER — Emergency Department (HOSPITAL_COMMUNITY)
Admission: EM | Admit: 2018-06-20 | Discharge: 2018-06-20 | Disposition: A | Discharge status: Home / Self Care | Payer: Medicaid Other | Attending: Emergency Medicine | Admitting: Emergency Medicine

## 2018-06-20 ENCOUNTER — Other Ambulatory Visit: Payer: Self-pay

## 2018-06-20 DIAGNOSIS — F17210 Nicotine dependence, cigarettes, uncomplicated: Secondary | ICD-10-CM | POA: Insufficient documentation

## 2018-06-20 DIAGNOSIS — G43909 Migraine, unspecified, not intractable, without status migrainosus: Secondary | ICD-10-CM | POA: Insufficient documentation

## 2018-06-20 DIAGNOSIS — G43709 Chronic migraine without aura, not intractable, without status migrainosus: Secondary | ICD-10-CM

## 2018-06-20 MED ORDER — DIPHENHYDRAMINE HCL 50 MG/ML IJ SOLN
25.0000 mg | Freq: Once | INTRAMUSCULAR | Status: AC
  Administered 2018-06-20: 25 mg via INTRAVENOUS
  Filled 2018-06-20: qty 1

## 2018-06-20 MED ORDER — SODIUM CHLORIDE 0.9 % IV BOLUS
1000.0000 mL | Freq: Once | INTRAVENOUS | Status: AC
  Administered 2018-06-20: 1000 mL via INTRAVENOUS

## 2018-06-20 MED ORDER — DEXAMETHASONE SODIUM PHOSPHATE 4 MG/ML IJ SOLN
4.0000 mg | Freq: Once | INTRAMUSCULAR | Status: AC
  Administered 2018-06-20: 4 mg via INTRAVENOUS
  Filled 2018-06-20: qty 1

## 2018-06-20 MED ORDER — METOCLOPRAMIDE HCL 5 MG/ML IJ SOLN
10.0000 mg | Freq: Once | INTRAMUSCULAR | Status: AC
  Administered 2018-06-20: 10 mg via INTRAVENOUS
  Filled 2018-06-20: qty 2

## 2018-06-20 MED ORDER — KETOROLAC TROMETHAMINE 15 MG/ML IJ SOLN
15.0000 mg | Freq: Once | INTRAMUSCULAR | Status: AC
  Administered 2018-06-20: 15 mg via INTRAVENOUS
  Filled 2018-06-20: qty 1

## 2018-06-20 NOTE — ED Notes (Signed)
Patient verbalizes understanding of discharge instructions. Opportunity for questioning and answers were provided. Armband removed by staff, pt discharged from ED.  

## 2018-06-20 NOTE — ED Provider Notes (Signed)
[facility] EMERGENCY DEPARTMENT Provider Note   CSN: [id number] Arrival date & time: 06/20/18  1940    History   Chief Complaint Chief Complaint  Patient presents with  . Headache    HPI Kimberly Bradley is a 28 y.o. female with past medical history of bilateral tubal ligation, chronic migraines, who presents today for evaluation of a migraine headache.  She reports that this headache has been going on for approximately 6 days.  It feels like her usual migraine headaches and she denies it feeling different in any way.  She went to urgent care yesterday where they gave her a prescription however she reports that her insurance would not fill it.  She denies any fevers or neck pain.  No recent trauma.  She does not see a Restaurant manager, fast food.  She denies any weakness, numbness, or tingling.  She says that this migraine is not concerning to her as it feels like her normal migraines.       HPI  Past Medical History:  Diagnosis Date  . Headache(784.0)   . SVD (spontaneous vaginal delivery)    x 3    Patient Active Problem List   Diagnosis Date Noted  . Chronic migraine without aura with status migrainosus, not intractable 09/19/2017  . Acute pain of left knee 09/19/2017  . Surgical history of tubal ligation 09/19/2017  . AP (abdominal pain) 08/11/2015  . Headache 04/04/2015  . Abdominal pain, left lower quadrant 07/13/2013  . Sterilization 05/03/2012  . Small for gestational age 23/16/2013  . Supervision of normal subsequent pregnancy 11/17/2011  . Loss of hearing 05/18/2011  . Abdominal cramping 09/18/2010  . UMBILICAL HERNIA 26/94/8546  . 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 ABNORMAL PAP SMEAR, LGSIL 12/27/2007  . ABORTION, SPONTANEOUS 10/03/2007  . OVARIAN CYST, RIGHT 09/08/2007  . TOBACCO USE, QUIT 05/26/2006    Past Surgical History:  Procedure Laterality Date  . HERNIA REPAIR  2010  . LAPAROSCOPIC TUBAL LIGATION  05/03/2012   Procedure: LAPAROSCOPIC TUBAL  LIGATION;  Surgeon: Lavonia Drafts, MD;  Location: [facility];  Service: Gynecology;  Laterality: N/A;     OB History    Gravida  4   Para  3   Term  3   Preterm      AB  1   Living  3     SAB  1   TAB      Ectopic      Multiple      Live Births  3            Home Medications    Prior to Admission medications   Medication Sig Start Date End Date Taking? Authorizing Provider  fluticasone (FLONASE) 50 MCG/ACT nasal spray Place 2 sprays into both nostrils daily for 7 days. Then as needed for sinus pain and inflammation 06/19/18 06/26/18  Rodriguez-Southworth, Sunday Spillers, PA-C  ibuprofen (ADVIL,MOTRIN) 200 MG tablet Take 200 mg by mouth every 6 (six) hours as needed for headache. 3 prn headaches    [provider]  rizatriptan (MAXALT-MLT) 5 MG disintegrating tablet Take 1 tablet (5 mg total) by mouth as needed for migraine. May repeat in 2 hours if needed 06/19/18   Rodriguez-Southworth, Sunday Spillers, PA-C    Family History Family History  Problem Relation Age of Onset  . Cancer Mother     Social History Social History   Tobacco Use  . Smoking status: Current Every Day Smoker    Packs/day: 0.50    Years: 6.00  Pack years: 3.00    Types: Cigarettes    Last attempt to quit: 11/12/2011    Years since quitting: 6.6  . Smokeless tobacco: Never Used  Substance Use Topics  . Alcohol use: Yes    Comment: socially  . Drug use: No     Allergies   Tramadol hcl and Codeine   Review of Systems Review of Systems  Constitutional: Negative for chills and fever.  HENT: Negative for congestion, facial swelling, hearing loss, sinus pressure and sinus pain.   Eyes: Positive for photophobia. Negative for visual disturbance.  Respiratory: Negative for chest tightness and shortness of breath.   Cardiovascular: Negative for chest pain.  Genitourinary: Negative for dysuria.  Musculoskeletal: Negative for back pain and neck pain.  Skin: Negative for color change,  rash and wound.  Neurological: Positive for headaches. Negative for dizziness, tremors, facial asymmetry, speech difficulty, weakness, light-headedness and numbness.  All other systems reviewed and are negative.    Physical Exam Updated Vital Signs BP 113/67 (BP Location: Right Arm)   Pulse 60   Temp 98.5 F (36.9 C)   Resp 14   Ht 5\' 7"  (1.702 m)   Wt 52.2 kg   LMP 06/19/2018   SpO2 100%   BMI 18.01 kg/m   Physical Exam Vitals signs and nursing note reviewed.  Constitutional:      General: She is not in acute distress.    Appearance: She is well-developed.  HENT:     Head: Normocephalic and atraumatic.  Eyes:     Extraocular Movements: Extraocular movements intact.     Conjunctiva/sclera: Conjunctivae normal.     Pupils: Pupils are equal, round, and reactive to light.  Neck:     Musculoskeletal: Normal range of motion and neck supple. No neck rigidity.  Cardiovascular:     Rate and Rhythm: Normal rate and regular rhythm.     Heart sounds: Normal heart sounds. No murmur.  Pulmonary:     Effort: Pulmonary effort is normal. No respiratory distress.     Breath sounds: Normal breath sounds.  Abdominal:     Palpations: Abdomen is soft.     Tenderness: There is no abdominal tenderness.  Lymphadenopathy:     Cervical: No cervical adenopathy.  Skin:    General: Skin is warm and dry.  Neurological:     Mental Status: She is alert.     GCS: GCS eye subscore is 4. GCS verbal subscore is 5. GCS motor subscore is 6.     Cranial Nerves: No cranial nerve deficit or dysarthria.     Sensory: No sensory deficit.     Motor: No weakness.     Coordination: Coordination normal.     Gait: Gait normal.  Psychiatric:        Mood and Affect: Mood normal.        Behavior: Behavior normal.      ED Treatments / Results  Labs (all labs ordered are listed, but only abnormal results are displayed) Labs Reviewed - No data to display  EKG None  Radiology No results found.   Procedures Procedures (including critical care time)  Medications Ordered in ED Medications  metoCLOPramide (REGLAN) injection 10 mg (10 mg Intravenous Given 06/20/18 2116)  dexamethasone (DECADRON) injection 4 mg (4 mg Intravenous Given 06/20/18 2117)  ketorolac (TORADOL) 15 MG/ML injection 15 mg (15 mg Intravenous Given 06/20/18 2116)  diphenhydrAMINE (BENADRYL) injection 25 mg (25 mg Intravenous Given 06/20/18 2116)  sodium chloride 0.9 % bolus 1,000  mL (1,000 mLs Intravenous New Bag/Given 06/20/18 2124)     Initial Impression / Assessment and Plan / ED Course  I have reviewed the triage vital signs and the nursing notes.  Pertinent labs & imaging results that were available during my care of the patient were reviewed by me and considered in my medical decision making (see chart for details).  Clinical Course as of Jun 20 2246  Tue Jun 20, 2018  2244 Patient reevaluated, she says her head feels better and she wishes to go home at this time.   [EH]    Clinical Course User Index [EH] Lorin Glass, PA-C      Pt HA treated and improved while in ED.  Presentation is like pts typical HA and non concerning for [facility], ICH, Meningitis, or temporal arteritis. Pt is afebrile with no focal neuro deficits, nuchal rigidity, or change in vision. Pt is to follow up with PCP to discuss prophylactic medication.  She denies any trauma.  She was offered blood work which she declined.    Return precautions were discussed with patient who states their understanding.  At the time of discharge patient denied any unaddressed complaints or concerns.  Patient is agreeable for discharge home.   Final Clinical Impressions(s) / ED Diagnoses   Final diagnoses:  Chronic migraine without aura without status migrainosus, not intractable    ED Discharge Orders    None       Lorin Glass, Hershal Coria 06/20/18 2248    Lennice Sites, DO 06/20/18 2355

## 2018-06-20 NOTE — Discharge Instructions (Addendum)
Today you received medications that may make you sleepy or impair your ability to make decisions.  For the next 24 hours please do not drive, operate heavy machinery, care for a small child with out another adult present, or perform any activities that may cause harm to you or someone else if you were to fall asleep or be impaired.   

## 2018-06-20 NOTE — ED Triage Notes (Signed)
Pt here for evaluation of headache x 7 days. Sts she's tried OTC meds without relief. Was seen at [facility] yesterday and was given rx her insurance doesn't cover so she did not fill it.

## 2018-06-21 ENCOUNTER — Emergency Department (HOSPITAL_COMMUNITY)
Admission: EM | Admit: 2018-06-21 | Discharge: 2018-06-21 | Disposition: A | Discharge status: Home / Self Care | Payer: Self-pay | Attending: Emergency Medicine | Admitting: Emergency Medicine

## 2018-06-21 ENCOUNTER — Encounter (HOSPITAL_COMMUNITY): Payer: Self-pay | Admitting: Emergency Medicine

## 2018-06-21 ENCOUNTER — Emergency Department (HOSPITAL_COMMUNITY): Payer: Self-pay

## 2018-06-21 ENCOUNTER — Other Ambulatory Visit: Payer: Self-pay

## 2018-06-21 ENCOUNTER — Telehealth: Payer: Self-pay | Admitting: Family Medicine

## 2018-06-21 DIAGNOSIS — R519 Headache, unspecified: Secondary | ICD-10-CM

## 2018-06-21 DIAGNOSIS — F17210 Nicotine dependence, cigarettes, uncomplicated: Secondary | ICD-10-CM | POA: Insufficient documentation

## 2018-06-21 DIAGNOSIS — R51 Headache: Secondary | ICD-10-CM | POA: Insufficient documentation

## 2018-06-21 LAB — I-STAT BETA HCG BLOOD, ED (MC, WL, AP ONLY): I-stat hCG, quantitative: <5 m[IU]/mL (ref <5)

## 2018-06-21 LAB — BASIC METABOLIC PANEL
Anion gap: 7 (ref 5–15)
BUN: 5 mg/dL — AB (ref 6–20)
CO2: 24 mmol/L (ref 22–32)
Calcium: 9.3 mg/dL (ref 8.9–10.3)
Chloride: 105 mmol/L (ref 98–111)
Creatinine, Ser: 0.85 mg/dL (ref 0.44–1.00)
GFR calc Af Amer: >60 mL/min (ref >60)
GLUCOSE: 103 mg/dL — AB (ref 70–99)
Potassium: 3.9 mmol/L (ref 3.5–5.1)
Sodium: 136 mmol/L (ref 135–145)

## 2018-06-21 LAB — CBC
HCT: 40.7 % (ref 36.0–46.0)
Hemoglobin: 13.6 g/dL (ref 12.0–15.0)
MCH: 32 pg (ref 26.0–34.0)
MCHC: 33.4 g/dL (ref 30.0–36.0)
MCV: 95.8 fL (ref 80.0–100.0)
Platelets: 217 10*3/uL (ref 150–400)
RBC: 4.25 MIL/uL (ref 3.87–5.11)
RDW: 11.9 % (ref 11.5–15.5)
WBC: 12.4 10*3/uL — AB (ref 4.0–10.5)
nRBC: 0 % (ref 0.0–0.2)

## 2018-06-21 MED ORDER — SODIUM CHLORIDE 0.9 % IV BOLUS
1000.0000 mL | Freq: Once | INTRAVENOUS | Status: AC
  Administered 2018-06-21: 1000 mL via INTRAVENOUS

## 2018-06-21 MED ORDER — DEXAMETHASONE SODIUM PHOSPHATE 10 MG/ML IJ SOLN
10.0000 mg | Freq: Once | INTRAMUSCULAR | Status: AC
Start: 2018-06-21 — End: 2018-06-21
  Administered 2018-06-21: 10 mg via INTRAVENOUS
  Filled 2018-06-21: qty 1

## 2018-06-21 MED ORDER — IOHEXOL 350 MG/ML SOLN
100.0000 mL | Freq: Once | INTRAVENOUS | Status: AC | PRN
  Administered 2018-06-21: 100 mL via INTRAVENOUS

## 2018-06-21 MED ORDER — DIPHENHYDRAMINE HCL 50 MG/ML IJ SOLN
25.0000 mg | Freq: Once | INTRAMUSCULAR | Status: AC
  Administered 2018-06-21: 25 mg via INTRAVENOUS
  Filled 2018-06-21: qty 1

## 2018-06-21 MED ORDER — PROCHLORPERAZINE EDISYLATE 10 MG/2ML IJ SOLN
10.0000 mg | Freq: Once | INTRAMUSCULAR | Status: AC
  Administered 2018-06-21: 10 mg via INTRAVENOUS
  Filled 2018-06-21: qty 2

## 2018-06-21 NOTE — Telephone Encounter (Signed)
Mother calling concerning daughter using sign language interpreter.  Mother notes daughter has been having severe "headache" x 8 days, continuous, pain improved slightly with IV cocktail in ED yesterday, however pain came back immediately. She notes patient has been in tears all day, unable to get out of bed.  Called patient and spoke to patient directly.   Rates headache 8/10, "worst headache of my life". Patient notes pain was unilateral (right sided) but now it is all over (bilateral). Patient notes has history of migraine headaches and this headache feels different. Having sensitivity to light/sound. Vomited several times x 2 days Kimberly Bradley and Sunday). Was seen in ED and received migraine cocktail that improved pain temporarily but intense pain came back immediately. Endorses chills since Sunday. Endorses neck pain. Pain when bending head forward.  Some pain behind eyes with intermittent blurry vision.  Endorses weakness all over. Denies any trauma. Denies any numbness or tingling. Denies any treatment since ED visit. Usually treats migraines with Ibuprofen 600mg  or Excedrin but these are no longer providing any improvement.   Case discussed with Dr. Erin Hearing. Given nature of headache and that patient endorses headache being different from normal migraine, recommended patient go to ED to be evaluated with possible imaging to rule out worst case scenario. Patient agreed to this. She notes she has someone that can drive her to the ED.   Will forward information to PCP.

## 2018-06-21 NOTE — Discharge Instructions (Signed)
Your work-up today is reassuring, the CT scan of your head does not show any evidence of aneurysm or bleeding in the brain.  I am glad your headache is feeling better.  Make sure you are drinking plenty of water if you continue having severe headaches like this is like you to follow-up with a neurologist.  Return if any of the following scenarios occur.  Get help right away if: Your headache gets very bad quickly. Your headache gets worse after a lot of physical activity. You keep throwing up. You have a stiff neck. You have trouble seeing. You have trouble speaking. You have pain in the eye or ear. Your muscles are weak or you lose muscle control. You lose your balance or have trouble walking. You feel like you will pass out (faint) or you pass out. You are mixed up (confused). You have a seizure.

## 2018-06-21 NOTE — ED Triage Notes (Signed)
Pt arrives to ED for second visit in two days for a migraine that started 8 days ago- with hx of migraines. Pt reports taking OTC pain medication as well as family members narcotics for pain. Pt is alert and ox4.

## 2018-06-21 NOTE — ED Notes (Signed)
All appropriate discharge materials reviewed at length with patient. Time for questions provided. Pt has no other questions at this time and verbalizes understanding of all provided materials.  

## 2018-06-21 NOTE — ED Provider Notes (Signed)
[facility] EMERGENCY DEPARTMENT Provider Note   CSN: [id number] Arrival date & time: 06/21/18  1608    History   Chief Complaint Chief Complaint  Patient presents with   Headache    HPI Kimberly Bradley is a 28 y.o. female.     Kimberly Bradley is a 28 y.o. female with a history of chronic migraines, who presents to the emergency department for evaluation of headache.  Patient reports this headache has been persistent for the past 8 days.  She was seen at urgent care 2 days prior and was seen in the emergency department last night, she was treated for headache with migraine cocktail which she reports gave temporary relief but upon arriving home headache came right back and has been significantly worse.  She reports that she has a history of chronic migraines but now headache is feeling much more severe than usual, her migraines are usually only present on one side of the head, but she reports a generalized headache present behind her eyes and also at the base of her head with pain radiating into her neck.  She reports her neck feels sore but she does not have any neck stiffness.  She has not had any recent fevers or chills.  Headache is worse and she leans forward.  She reports sensitivity to light and sound but no visual changes.  Patient has taken numerous over-the-counter medications as well as the family's narcotic pain medication without improvement in her headache.  She denies any numbness weakness or tingling.  No facial asymmetry or change in speech.  No dizziness.  No chest pain, shortness of breath or abdominal pain.  No rash.     Past Medical History:  Diagnosis Date   Headache(784.0)    SVD (spontaneous vaginal delivery)    x 3    Patient Active Problem List   Diagnosis Date Noted   Chronic migraine without aura with status migrainosus, not intractable 09/19/2017   Acute pain of left knee 09/19/2017   Surgical history of tubal ligation  09/19/2017   AP (abdominal pain) 08/11/2015   Headache 04/04/2015   Abdominal pain, left lower quadrant 07/13/2013   Sterilization 05/03/2012   Small for gestational age 63/16/2013   Supervision of normal subsequent pregnancy 11/17/2011   Loss of hearing 05/18/2011   Abdominal cramping 59/16/3846   UMBILICAL HERNIA 65/99/3570   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   ABNORMAL PAP SMEAR, LGSIL 12/27/2007   ABORTION, SPONTANEOUS 10/03/2007   OVARIAN CYST, RIGHT 09/08/2007   TOBACCO USE, QUIT 05/26/2006    Past Surgical History:  Procedure Laterality Date   HERNIA REPAIR  2010   LAPAROSCOPIC TUBAL LIGATION  05/03/2012   Procedure: LAPAROSCOPIC TUBAL LIGATION;  Surgeon: Lavonia Drafts, MD;  Location: [facility];  Service: Gynecology;  Laterality: N/A;     OB History    Gravida  4   Para  3   Term  3   Preterm      AB  1   Living  3     SAB  1   TAB      Ectopic      Multiple      Live Births  3            Home Medications    Prior to Admission medications   Medication Sig Start Date End Date Taking? Authorizing Provider  fluticasone (FLONASE) 50 MCG/ACT nasal spray Place 2 sprays into both nostrils daily for 7 days. Then as  needed for sinus pain and inflammation 06/19/18 06/26/18  Rodriguez-Southworth, Sunday Spillers, PA-C  ibuprofen (ADVIL,MOTRIN) 200 MG tablet Take 200 mg by mouth every 6 (six) hours as needed for headache. 3 prn headaches    [provider]  rizatriptan (MAXALT-MLT) 5 MG disintegrating tablet Take 1 tablet (5 mg total) by mouth as needed for migraine. May repeat in 2 hours if needed 06/19/18   Rodriguez-Southworth, Sunday Spillers, PA-C    Family History Family History  Problem Relation Age of Onset   Cancer Mother     Social History Social History   Tobacco Use   Smoking status: Current Every Day Smoker    Packs/day: 0.50    Years: 6.00    Pack years: 3.00    Types: Cigarettes    Last attempt to quit: 11/12/2011    Years  since quitting: 6.6   Smokeless tobacco: Never Used  Substance Use Topics   Alcohol use: Yes    Comment: socially   Drug use: No     Allergies   Tramadol hcl and Codeine   Review of Systems Review of Systems  Constitutional: Negative for chills and fever.  HENT: Negative.   Eyes: Positive for photophobia. Negative for visual disturbance.  Respiratory: Negative for cough and shortness of breath.   Cardiovascular: Negative for chest pain.  Gastrointestinal: Positive for nausea and vomiting. Negative for abdominal pain.  Genitourinary: Negative for dysuria and frequency.  Musculoskeletal: Positive for neck pain. Negative for arthralgias, back pain and neck stiffness.  Skin: Negative for color change and rash.  Neurological: Positive for headaches. Negative for dizziness, seizures, syncope, facial asymmetry, speech difficulty, weakness, light-headedness and numbness.     Physical Exam Updated Vital Signs BP 114/68    Pulse 64    Temp 98.4 F (36.9 C)    Resp 18    LMP 06/19/2018    SpO2 100%   Physical Exam Vitals signs and nursing note reviewed.  Constitutional:      General: She is not in acute distress.    Appearance: She is well-developed and normal weight. She is not ill-appearing or diaphoretic.  HENT:     Head: Normocephalic and atraumatic.  Eyes:     General:        Right eye: No discharge.        Left eye: No discharge.     Extraocular Movements: Extraocular movements intact.     Pupils: Pupils are equal, round, and reactive to light.  Neck:     Musculoskeletal: Neck supple. No neck rigidity.     Meningeal: Brudzinski's sign and Kernig's sign absent.     Comments: No meningismus Cardiovascular:     Rate and Rhythm: Normal rate and regular rhythm.     Heart sounds: Normal heart sounds. No murmur. No friction rub. No gallop.   Pulmonary:     Effort: Pulmonary effort is normal. No respiratory distress.     Breath sounds: Normal breath sounds.     Comments:  Respirations equal and unlabored, patient able to speak in full sentences, lungs clear to auscultation bilaterally Abdominal:     General: Bowel sounds are normal. There is no distension.     Palpations: Abdomen is soft. There is no mass.     Tenderness: There is no abdominal tenderness. There is no guarding.     Comments: Abdomen soft, nondistended, nontender to palpation in all quadrants without guarding or peritoneal signs  Skin:    General: Skin is warm and dry.  Capillary Refill: Capillary refill takes less than 2 seconds.  Neurological:     Mental Status: She is alert and oriented to person, place, and time.     GCS: GCS eye subscore is 4. GCS verbal subscore is 5. GCS motor subscore is 6.     Coordination: Coordination normal.     Comments: Speech is clear, able to follow commands CN III-XII intact Normal strength in upper and lower extremities bilaterally including dorsiflexion and plantar flexion, strong and equal grip strength Sensation normal to light and sharp touch Moves extremities without ataxia, coordination intact Normal finger to nose and rapid alternating movements No pronator drift  Psychiatric:        Mood and Affect: Mood normal.        Behavior: Behavior normal.      ED Treatments / Results  Labs (all labs ordered are listed, but only abnormal results are displayed) Labs Reviewed  BASIC METABOLIC PANEL - Abnormal; Notable for the following components:      Result Value   Glucose, Bld 103 (*)    BUN 5 (*)    All other components within normal limits  CBC - Abnormal; Notable for the following components:   WBC 12.4 (*)    All other components within normal limits  I-STAT BETA HCG BLOOD, ED ([facility], [facility], [facility] ONLY)    EKG None  Radiology Ct Angio Head W Or Wo Contrast  Result Date: 06/21/2018 CLINICAL DATA:  Initial evaluation for acute severe headache. EXAM: CT ANGIOGRAPHY HEAD AND NECK TECHNIQUE: Multidetector CT imaging of the head and neck was  performed using the standard protocol during bolus administration of intravenous contrast. Multiplanar CT image reconstructions and MIPs were obtained to evaluate the vascular anatomy. Carotid stenosis measurements (when applicable) are obtained utilizing NASCET criteria, using the distal internal carotid diameter as the denominator. CONTRAST:  190mL OMNIPAQUE IOHEXOL 350 MG/ML SOLN COMPARISON:  Prior CT from 04/15/2015. FINDINGS: CT HEAD FINDINGS Brain: Cerebral volume within normal limits for patient age. No evidence for acute intracranial hemorrhage. No findings to suggest acute large vessel territory infarct. No mass lesion, midline shift, or mass effect. Stable ventricular morphology with prominence of the occipital horns. No hydrocephalus. No extra-axial fluid collection identified. Vascular: No hyperdense vessel identified. Skull: Scalp soft tissues demonstrate no acute abnormality. Calvarium intact. Sinuses/Orbits: Globes and orbital soft tissues within normal limits. Visualized paranasal sinuses are clear. No mastoid effusion. CTA NECK FINDINGS Aortic arch: Visualized aortic arch of normal caliber with normal branch pattern. No flow-limiting stenosis about the origin of the great vessels. Visualized subclavian arteries widely patent. Right carotid system: Right common and internal carotid arteries are widely patent without stenosis, dissection, or occlusion. Left carotid system: Left common and internal carotid arteries widely patent without stenosis, dissection, or occlusion. Vertebral arteries: Left vertebral artery arises from the aortic arch and is diffusely hypoplastic. Vertebral arteries widely patent within the neck without stenosis, dissection, or occlusion. Skeleton: No acute osseous abnormality. No discrete lytic or blastic osseous lesions. Other neck: No other acute soft tissue abnormality within the neck. Upper chest: No acute abnormality within the visualized upper chest. Review of the MIP  images confirms the above findings CTA HEAD FINDINGS Anterior circulation: Internal carotid arteries widely patent to the termini without stenosis, aneurysm, or other abnormality. A1 segments widely patent. Normal anterior communicating artery. ACAs widely patent to their distal aspects without stenosis. Middle cerebral arteries widely patent without stenosis or other abnormality. Posterior circulation: Vertebral arteries widely patent  to the vertebrobasilar junction without stenosis. Posterior inferior cerebral arteries patent bilaterally. Basilar artery well perfused to its distal aspect. Superior cerebellar and posterior cerebral arteries widely patent bilaterally. Venous sinuses: Grossly patent allowing for timing of the contrast bolus. Anatomic variants: None significant. No intracranial aneurysm or other vascular abnormality. Delayed phase: No abnormal enhancement. Review of the MIP images confirms the above findings IMPRESSION: 1. Normal CTA of the head and neck. No aneurysm or other acute vascular abnormality 2. No acute intracranial process. Electronically Signed   By: Jeannine Boga M.D.   On: 06/21/2018 19:18   Ct Angio Neck W And/or Wo Contrast  Result Date: 06/21/2018 CLINICAL DATA:  Initial evaluation for acute severe headache. EXAM: CT ANGIOGRAPHY HEAD AND NECK TECHNIQUE: Multidetector CT imaging of the head and neck was performed using the standard protocol during bolus administration of intravenous contrast. Multiplanar CT image reconstructions and MIPs were obtained to evaluate the vascular anatomy. Carotid stenosis measurements (when applicable) are obtained utilizing NASCET criteria, using the distal internal carotid diameter as the denominator. CONTRAST:  121mL OMNIPAQUE IOHEXOL 350 MG/ML SOLN COMPARISON:  Prior CT from 04/15/2015. FINDINGS: CT HEAD FINDINGS Brain: Cerebral volume within normal limits for patient age. No evidence for acute intracranial hemorrhage. No findings to  suggest acute large vessel territory infarct. No mass lesion, midline shift, or mass effect. Stable ventricular morphology with prominence of the occipital horns. No hydrocephalus. No extra-axial fluid collection identified. Vascular: No hyperdense vessel identified. Skull: Scalp soft tissues demonstrate no acute abnormality. Calvarium intact. Sinuses/Orbits: Globes and orbital soft tissues within normal limits. Visualized paranasal sinuses are clear. No mastoid effusion. CTA NECK FINDINGS Aortic arch: Visualized aortic arch of normal caliber with normal branch pattern. No flow-limiting stenosis about the origin of the great vessels. Visualized subclavian arteries widely patent. Right carotid system: Right common and internal carotid arteries are widely patent without stenosis, dissection, or occlusion. Left carotid system: Left common and internal carotid arteries widely patent without stenosis, dissection, or occlusion. Vertebral arteries: Left vertebral artery arises from the aortic arch and is diffusely hypoplastic. Vertebral arteries widely patent within the neck without stenosis, dissection, or occlusion. Skeleton: No acute osseous abnormality. No discrete lytic or blastic osseous lesions. Other neck: No other acute soft tissue abnormality within the neck. Upper chest: No acute abnormality within the visualized upper chest. Review of the MIP images confirms the above findings CTA HEAD FINDINGS Anterior circulation: Internal carotid arteries widely patent to the termini without stenosis, aneurysm, or other abnormality. A1 segments widely patent. Normal anterior communicating artery. ACAs widely patent to their distal aspects without stenosis. Middle cerebral arteries widely patent without stenosis or other abnormality. Posterior circulation: Vertebral arteries widely patent to the vertebrobasilar junction without stenosis. Posterior inferior cerebral arteries patent bilaterally. Basilar artery well perfused to  its distal aspect. Superior cerebellar and posterior cerebral arteries widely patent bilaterally. Venous sinuses: Grossly patent allowing for timing of the contrast bolus. Anatomic variants: None significant. No intracranial aneurysm or other vascular abnormality. Delayed phase: No abnormal enhancement. Review of the MIP images confirms the above findings IMPRESSION: 1. Normal CTA of the head and neck. No aneurysm or other acute vascular abnormality 2. No acute intracranial process. Electronically Signed   By: Jeannine Boga M.D.   On: 06/21/2018 19:18    Procedures Procedures (including critical care time)  Medications Ordered in ED Medications  sodium chloride 0.9 % bolus 1,000 mL (0 mLs Intravenous Stopped 06/21/18 1935)  prochlorperazine (COMPAZINE) injection 10 mg (10  mg Intravenous Given 06/21/18 1632)  diphenhydrAMINE (BENADRYL) injection 25 mg (25 mg Intravenous Given 06/21/18 1632)  dexamethasone (DECADRON) injection 10 mg (10 mg Intravenous Given 06/21/18 1633)  iohexol (OMNIPAQUE) 350 MG/ML injection 100 mL (100 mLs Intravenous Contrast Given 06/21/18 1733)     Initial Impression / Assessment and Plan / ED Course  I have reviewed the triage vital signs and the nursing notes.  Pertinent labs & imaging results that were available during my care of the patient were reviewed by me and considered in my medical decision making (see chart for details).  Patient returns to the emergency department for evaluation of persistent headache, this is her third visit.  She reports headache is now significantly worse than usual and does not feel like her typical migraine.  She reports some associated neck pain but she does not have any signs of meningismus and has not had any fevers or other infectious symptoms.  Differential includes migraine headache, subarachnoid hemorrhage, intracranial hemorrhage, I feel that meningitis is unlikely, patient is not obese and is not experiencing visual changes,  feel that pseudotumor cerebri or venous sinus thrombosis are unlikely.  Patient contacted her PCP today who recommended sending to the emergency department for further evaluation given that this headache no longer feels like her typical migraine.  Patient does not have any focal neurologic deficits today but given severity of headache with some associated neck pain, pain is present at the base of the head and behind the eyes, does raise some concern for possible subarachnoid hemorrhage.  Will get CTA of the head and neck, and basic labs and will give headache cocktail and Decadron.  Labs overall reassuring, patient has mild leukocytosis but I suspect this is related to steroid she received yesterday.  No other acute electrolyte abnormalities, negative pregnancy.  CTA of the head and neck returned and shows no evidence of intracranial bleeding, aneurysm or other acute vascular abnormality.  On reevaluation patient reports significant improvement in her headache and she is requesting to be discharged home.  I encouraged neurology follow-up of severe headaches continue.  Return precautions discussed.  Patient expresses understanding and agreement with plan.  Final Clinical Impressions(s) / ED Diagnoses   Final diagnoses:  Bad headache    ED Discharge Orders    None       Janet Berlin 06/21/18 2318    Maudie Flakes, MD 06/24/18 [phone]

## 2018-09-19 ENCOUNTER — Encounter (HOSPITAL_COMMUNITY): Payer: Self-pay | Admitting: Emergency Medicine

## 2018-09-19 ENCOUNTER — Emergency Department (HOSPITAL_COMMUNITY)
Admission: EM | Admit: 2018-09-19 | Discharge: 2018-09-19 | Disposition: A | Discharge status: Home / Self Care | Payer: Medicaid Other | Attending: Emergency Medicine | Admitting: Emergency Medicine

## 2018-09-19 ENCOUNTER — Emergency Department (HOSPITAL_COMMUNITY): Payer: Medicaid Other

## 2018-09-19 ENCOUNTER — Other Ambulatory Visit: Payer: Self-pay

## 2018-09-19 DIAGNOSIS — Z79899 Other long term (current) drug therapy: Secondary | ICD-10-CM | POA: Insufficient documentation

## 2018-09-19 DIAGNOSIS — F17210 Nicotine dependence, cigarettes, uncomplicated: Secondary | ICD-10-CM | POA: Insufficient documentation

## 2018-09-19 DIAGNOSIS — F1910 Other psychoactive substance abuse, uncomplicated: Secondary | ICD-10-CM | POA: Insufficient documentation

## 2018-09-19 DIAGNOSIS — R519 Headache, unspecified: Secondary | ICD-10-CM

## 2018-09-19 DIAGNOSIS — R51 Headache: Secondary | ICD-10-CM | POA: Insufficient documentation

## 2018-09-19 LAB — CBC WITH DIFFERENTIAL/PLATELET
Abs Immature Granulocytes: 0.01 10*3/uL (ref 0.00–0.07)
Basophils Absolute: 0 10*3/uL (ref 0.0–0.1)
Basophils Relative: 0 %
Eosinophils Absolute: 0.1 10*3/uL (ref 0.0–0.5)
Eosinophils Relative: 2 %
HCT: 41.9 % (ref 36.0–46.0)
Hemoglobin: 13.9 g/dL (ref 12.0–15.0)
Immature Granulocytes: 0 %
Lymphocytes Relative: 20 %
Lymphs Abs: 1.2 10*3/uL (ref 0.7–4.0)
MCH: 32.9 pg (ref 26.0–34.0)
MCHC: 33.2 g/dL (ref 30.0–36.0)
MCV: 99.1 fL (ref 80.0–100.0)
Monocytes Absolute: 0.3 10*3/uL (ref 0.1–1.0)
Monocytes Relative: 5 %
Neutro Abs: 4.2 10*3/uL (ref 1.7–7.7)
Neutrophils Relative %: 73 %
Platelets: 206 10*3/uL (ref 150–400)
RBC: 4.23 MIL/uL (ref 3.87–5.11)
RDW: 12.4 % (ref 11.5–15.5)
WBC: 5.8 10*3/uL (ref 4.0–10.5)
nRBC: 0 % (ref 0.0–0.2)

## 2018-09-19 LAB — URINALYSIS, ROUTINE W REFLEX MICROSCOPIC
Glucose, UA: NEGATIVE mg/dL
Ketones, ur: 15 mg/dL — AB
Leukocytes,Ua: NEGATIVE
Nitrite: NEGATIVE
Protein, ur: NEGATIVE mg/dL
Specific Gravity, Urine: >1.03 — ABNORMAL HIGH (ref 1.005–1.030)
pH: 5.5 (ref 5.0–8.0)

## 2018-09-19 LAB — URINALYSIS, MICROSCOPIC (REFLEX): Bacteria, UA: NONE SEEN

## 2018-09-19 LAB — RAPID URINE DRUG SCREEN, HOSP PERFORMED
Amphetamines: NOT DETECTED
Barbiturates: NOT DETECTED
Benzodiazepines: POSITIVE — AB
Cocaine: POSITIVE — AB
Opiates: NOT DETECTED
Tetrahydrocannabinol: POSITIVE — AB

## 2018-09-19 LAB — COMPREHENSIVE METABOLIC PANEL
ALT: 17 U/L (ref 0–44)
AST: 17 U/L (ref 15–41)
Albumin: 4.2 g/dL (ref 3.5–5.0)
Alkaline Phosphatase: 49 U/L (ref 38–126)
Anion gap: 11 (ref 5–15)
BUN: 9 mg/dL (ref 6–20)
CO2: 21 mmol/L — ABNORMAL LOW (ref 22–32)
Calcium: 9.4 mg/dL (ref 8.9–10.3)
Chloride: 108 mmol/L (ref 98–111)
Creatinine, Ser: 1.17 mg/dL — ABNORMAL HIGH (ref 0.44–1.00)
GFR calc Af Amer: >60 mL/min (ref >60)
GFR calc non Af Amer: >60 mL/min (ref >60)
Glucose, Bld: 132 mg/dL — ABNORMAL HIGH (ref 70–99)
Potassium: 3.4 mmol/L — ABNORMAL LOW (ref 3.5–5.1)
Sodium: 140 mmol/L (ref 135–145)
Total Bilirubin: 1.4 mg/dL — ABNORMAL HIGH (ref 0.3–1.2)
Total Protein: 6.5 g/dL (ref 6.5–8.1)

## 2018-09-19 LAB — I-STAT BETA HCG BLOOD, ED (MC, WL, AP ONLY): I-stat hCG, quantitative: <5 m[IU]/mL (ref <5)

## 2018-09-19 LAB — CBG MONITORING, ED: Glucose-Capillary: 147 mg/dL — ABNORMAL HIGH (ref 70–99)

## 2018-09-19 LAB — TROPONIN I (HIGH SENSITIVITY): Troponin I (High Sensitivity): <2 ng/L (ref <18)

## 2018-09-19 LAB — SALICYLATE LEVEL: Salicylate Lvl: <7 mg/dL (ref 2.8–30.0)

## 2018-09-19 LAB — ACETAMINOPHEN LEVEL: Acetaminophen (Tylenol), Serum: <10 ug/mL — ABNORMAL LOW (ref 10–30)

## 2018-09-19 LAB — ETHANOL: Alcohol, Ethyl (B): <10 mg/dL (ref <10)

## 2018-09-19 MED ORDER — PROCHLORPERAZINE EDISYLATE 10 MG/2ML IJ SOLN
10.0000 mg | Freq: Once | INTRAMUSCULAR | Status: AC
  Administered 2018-09-19: 10 mg via INTRAVENOUS
  Filled 2018-09-19: qty 2

## 2018-09-19 MED ORDER — POTASSIUM CHLORIDE CRYS ER 20 MEQ PO TBCR
40.0000 meq | EXTENDED_RELEASE_TABLET | Freq: Once | ORAL | Status: AC
  Administered 2018-09-19: 40 meq via ORAL
  Filled 2018-09-19: qty 2

## 2018-09-19 MED ORDER — SODIUM CHLORIDE 0.9 % IV BOLUS
1000.0000 mL | Freq: Once | INTRAVENOUS | Status: AC
  Administered 2018-09-19: 1000 mL via INTRAVENOUS

## 2018-09-19 NOTE — ED Provider Notes (Signed)
[facility] EMERGENCY DEPARTMENT Provider Note   CSN: [id number] Arrival date & time: 09/19/18  0825    History   Chief Complaint No chief complaint on file.   HPI Kimberly Bradley is a 28 y.o. female with a past medical history of headaches, presents to ED for possible overdose. She admits to taking 10 xanax (2mg  each) for the past 5 days as well has snorting cocaine. She also tried "Acid" for the first time yesterday. Last use of any substance was about 12hrs ago. She presents to the ED today due to having a headache.  She has tried hydrocodone, oxycodone, Tylenol with no improvement in her symptoms. She also endorses shortness of breath but denies any chest pain.  She denies any loss of consciousness, head injury, leg swelling, vomiting but does endorse nausea.  She denies any sick contacts or being in close contact with anyone that has tested positive for COVID-19.    HPI  Past Medical History:  Diagnosis Date   Headache(784.0)    SVD (spontaneous vaginal delivery)    x 3    Patient Active Problem List   Diagnosis Date Noted   Chronic migraine without aura with status migrainosus, not intractable 09/19/2017   Acute pain of left knee 09/19/2017   Surgical history of tubal ligation 09/19/2017   AP (abdominal pain) 08/11/2015   Headache 04/04/2015   Abdominal pain, left lower quadrant 07/13/2013   Sterilization 05/03/2012   Small for gestational age 29/16/2013   Supervision of normal subsequent pregnancy 11/17/2011   Loss of hearing 05/18/2011   Abdominal cramping 63/03/6008   UMBILICAL HERNIA 93/23/5573   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   ABNORMAL PAP SMEAR, LGSIL 12/27/2007   ABORTION, SPONTANEOUS 10/03/2007   OVARIAN CYST, RIGHT 09/08/2007   TOBACCO USE, QUIT 05/26/2006    Past Surgical History:  Procedure Laterality Date   HERNIA REPAIR  2010   LAPAROSCOPIC TUBAL LIGATION  05/03/2012   Procedure: LAPAROSCOPIC TUBAL LIGATION;   Surgeon: Lavonia Drafts, MD;  Location: [facility];  Service: Gynecology;  Laterality: N/A;     OB History    Gravida  4   Para  3   Term  3   Preterm      AB  1   Living  3     SAB  1   TAB      Ectopic      Multiple      Live Births  3            Home Medications    Prior to Admission medications   Medication Sig Start Date End Date Taking? Authorizing Provider  fluticasone (FLONASE) 50 MCG/ACT nasal spray Place 2 sprays into both nostrils daily for 7 days. Then as needed for sinus pain and inflammation 06/19/18 06/26/18  Rodriguez-Southworth, Sunday Spillers, PA-C  ibuprofen (ADVIL,MOTRIN) 200 MG tablet Take 200 mg by mouth every 6 (six) hours as needed for headache. 3 prn headaches    [provider]  rizatriptan (MAXALT-MLT) 5 MG disintegrating tablet Take 1 tablet (5 mg total) by mouth as needed for migraine. May repeat in 2 hours if needed 06/19/18   Rodriguez-Southworth, Sunday Spillers, PA-C    Family History Family History  Problem Relation Age of Onset   Cancer Mother     Social History Social History   Tobacco Use   Smoking status: Current Every Day Smoker    Packs/day: 0.50    Years: 6.00    Pack years: 3.00  Types: Cigarettes    Last attempt to quit: 11/12/2011    Years since quitting: 6.8   Smokeless tobacco: Never Used  Substance Use Topics   Alcohol use: Yes    Comment: socially   Drug use: Yes    Types: Cocaine     Allergies   Tramadol hcl and Codeine   Review of Systems Review of Systems  Constitutional: Negative for appetite change, chills and fever.  HENT: Negative for ear pain, rhinorrhea, sneezing and sore throat.   Eyes: Negative for photophobia and visual disturbance.  Respiratory: Positive for shortness of breath. Negative for cough, chest tightness and wheezing.   Cardiovascular: Negative for chest pain and palpitations.  Gastrointestinal: Positive for nausea. Negative for abdominal pain, blood in stool,  constipation, diarrhea and vomiting.  Genitourinary: Negative for dysuria, hematuria and urgency.  Musculoskeletal: Negative for myalgias.  Skin: Negative for rash.  Neurological: Positive for headaches. Negative for dizziness, weakness and light-headedness.     Physical Exam Updated Vital Signs BP 117/73    Pulse 65    Temp 98 F (36.7 C) (Oral)    Resp 16    LMP 09/12/2018 (Exact Date)    SpO2 98%   Physical Exam Vitals signs and nursing note reviewed.  Constitutional:      General: She is not in acute distress.    Appearance: She is well-developed.     Comments: Sleepy.  HENT:     Head: Normocephalic and atraumatic.     Nose: Nose normal.  Eyes:     General: No scleral icterus.       Right eye: No discharge.        Left eye: No discharge.     Conjunctiva/sclera: Conjunctivae normal.     Pupils: Pupils are equal, round, and reactive to light.  Neck:     Musculoskeletal: Normal range of motion and neck supple.  Cardiovascular:     Rate and Rhythm: Normal rate and regular rhythm.     Heart sounds: Normal heart sounds. No murmur. No friction rub. No gallop.   Pulmonary:     Effort: Pulmonary effort is normal. No respiratory distress.     Breath sounds: Normal breath sounds.  Abdominal:     General: Bowel sounds are normal. There is no distension.     Palpations: Abdomen is soft.     Tenderness: There is no abdominal tenderness. There is no guarding.  Musculoskeletal: Normal range of motion.  Skin:    General: Skin is warm and dry.     Findings: No rash.  Neurological:     Mental Status: She is alert.     Motor: No abnormal muscle tone.     Coordination: Coordination normal.     Comments: Pupils reactive. No facial asymmetry noted. Cranial nerves appear grossly intact. Sensation intact to light touch on face, BUE and BLE. Strength 5/5 in BUE and BLE.       ED Treatments / Results  Labs (all labs ordered are listed, but only abnormal results are displayed) Labs  Reviewed  COMPREHENSIVE METABOLIC PANEL - Abnormal; Notable for the following components:      Result Value   Potassium 3.4 (*)    CO2 21 (*)    Glucose, Bld 132 (*)    Creatinine, Ser 1.17 (*)    Total Bilirubin 1.4 (*)    All other components within normal limits  ACETAMINOPHEN LEVEL - Abnormal; Notable for the following components:   Acetaminophen (Tylenol), Serum <10 (*)  All other components within normal limits  RAPID URINE DRUG SCREEN, HOSP PERFORMED - Abnormal; Notable for the following components:   Cocaine POSITIVE (*)    Benzodiazepines POSITIVE (*)    Tetrahydrocannabinol POSITIVE (*)    All other components within normal limits  URINALYSIS, ROUTINE W REFLEX MICROSCOPIC - Abnormal; Notable for the following components:   APPearance HAZY (*)    Specific Gravity, Urine >1.030 (*)    Hgb urine dipstick SMALL (*)    Bilirubin Urine SMALL (*)    Ketones, ur 15 (*)    All other components within normal limits  CBG MONITORING, ED - Abnormal; Notable for the following components:   Glucose-Capillary 147 (*)    All other components within normal limits  ETHANOL  SALICYLATE LEVEL  CBC WITH DIFFERENTIAL/PLATELET  TROPONIN I (HIGH SENSITIVITY)  URINALYSIS, MICROSCOPIC (REFLEX)  I-STAT BETA HCG BLOOD, ED ([facility], [facility], [facility] ONLY)    EKG EKG Interpretation  Date/Time:  Tuesday September 19 2018 08:46:13 EDT Ventricular Rate:  55 PR Interval:    QRS Duration: 88 QT Interval:  458 QTC Calculation: 439 R Axis:   64 Text Interpretation:  Sinus rhythm No significant change since last tracing Confirmed by Deno Etienne [phone]) on 09/19/2018 8:47:39 AM   Radiology Dg Chest Port 1 View  Result Date: 09/19/2018 CLINICAL DATA:  Shortness of breath.  Drug overdose EXAM: PORTABLE CHEST 1 VIEW COMPARISON:  January 30, 2017 FINDINGS: No edema or consolidation. The heart size and pulmonary vascularity are normal. No adenopathy. There is slight lower thoracic dextroscoliosis. IMPRESSION: No edema  or consolidation. Electronically Signed   By: Lowella Grip III M.D.   On: 09/19/2018 09:11    Procedures Procedures (including critical care time)  Medications Ordered in ED Medications  sodium chloride 0.9 % bolus 1,000 mL (0 mLs Intravenous Stopped 09/19/18 1056)  prochlorperazine (COMPAZINE) injection 10 mg (10 mg Intravenous Given 09/19/18 1105)  potassium chloride SA (K-DUR) CR tablet 40 mEq (40 mEq Oral Given 09/19/18 1104)     Initial Impression / Assessment and Plan / ED Course  I have reviewed the triage vital signs and the nursing notes.  Pertinent labs & imaging results that were available during my care of the patient were reviewed by me and considered in my medical decision making (see chart for details).        28 year old female presents to ED for substance abuse, possible overdose.  She admits to taking 10 Xanax bars, snorting cocaine and using acid for the first time.  Last use of any substance was about 12 hours ago.  She presents to the ED today because she has a headache.  She states that this feels similar to her prior headaches.  She reports some shortness of breath.  No improvement with her home medications for her headache.  On my exam patient is sleepy but arousable and alert, oriented x4. No deficits to neurological exam noted. She denies any injuries or falls, vomiting, blurry vision.  Lab work here including acetaminophen level, salicylate level, CBC, CBG unremarkable.  Troponin is negative.  BMP with mild hypokalemia 3.4 which was repleted orally, creatinine of 1.17 improves with IV fluids.  EKG shows normal sinus rhythm.  Chest x-ray is unremarkable.  Patient was given fluid bolus, resolution of her headache.  Ambulate and passed p.o. challenge without difficulty.  She is requesting discharge home.  She was counseled extensively on polysubstance abuse.  Suspect that her headache chronic migraines.  Her shortness of breath  has resolved and she denies any chest  pain so I doubt ACS, PE or other emergent cause with cocaine.  We will have her follow-up with PCP and return for any worsening symptoms.  Patient is hemodynamically stable, in NAD, and able to ambulate in the ED. Evaluation does not show pathology that would require ongoing emergent intervention or inpatient treatment. I explained the diagnosis to the patient. Pain has been managed and has no complaints prior to discharge. Patient is comfortable with above plan and is stable for discharge at this time. All questions were answered prior to disposition. Strict return precautions for returning to the ED were discussed. Encouraged follow up with PCP.   An After Visit Summary was printed and given to the patient.   Portions of this note were generated with [employer]. Dictation errors may occur despite best attempts at proofreading.   Final Clinical Impressions(s) / ED Diagnoses   Final diagnoses:  Acute nonintractable headache, unspecified headache type  Polysubstance abuse [facility])    ED Discharge Orders    None       Delia Heady, PA-C 09/19/18 [place], DO 09/19/18 1241

## 2018-09-19 NOTE — ED Notes (Signed)
Pt ambulated without difficulty

## 2018-09-19 NOTE — Discharge Instructions (Signed)
It is important for you to stop using cocaine, acid and other illicit drugs. Return to the ED if you start to have worsening symptoms, loss of consciousness, chest pain, shortness of breath or leg swelling.

## 2018-09-19 NOTE — ED Notes (Signed)
Patient verbalizes understanding of discharge instructions. Opportunity for questioning and answers were provided. Armband removed by staff, pt discharged from ED.  

## 2018-09-19 NOTE — ED Triage Notes (Signed)
Pt here from home with c/o taking approx, 10 xanax bars along with some cocaine and first time time use of acid ,

## 2019-08-09 ENCOUNTER — Ambulatory Visit (INDEPENDENT_AMBULATORY_CARE_PROVIDER_SITE_OTHER): Payer: Self-pay | Admitting: Family Medicine

## 2019-08-09 ENCOUNTER — Other Ambulatory Visit: Payer: Self-pay

## 2019-08-09 VITALS — BP 92/62 | HR 77 | Wt 111.6 lb

## 2019-08-09 DIAGNOSIS — M545 Low back pain, unspecified: Secondary | ICD-10-CM

## 2019-08-09 DIAGNOSIS — M549 Dorsalgia, unspecified: Secondary | ICD-10-CM

## 2019-08-09 MED ORDER — KETOROLAC TROMETHAMINE 30 MG/ML IJ SOLN
30.0000 mg | Freq: Once | INTRAMUSCULAR | Status: AC
  Administered 2019-08-09: 30 mg via INTRAMUSCULAR

## 2019-08-09 MED ORDER — BACLOFEN 10 MG PO TABS
10.0000 mg | ORAL_TABLET | Freq: Three times a day (TID) | ORAL | 0 refills | Status: DC
Start: 2019-08-09 — End: 2020-12-31

## 2019-08-09 NOTE — Progress Notes (Signed)
    SUBJECTIVE:   CHIEF COMPLAINT / HPI:   Back Pain Patient is a 29y/o female with acute low back pain. This occurred yesterday and is described as on the left side only with no radiating pain. Pain is constant and feels like something is getting "pulled" with coughing and sneezing making it worse. She has tried Tylenol, Ibuprofen, and Oxycodone (her mother's prescription) to help and it only helps temporarily. She denies fever, chills, nausea, vomiting, constipation, dysuria, urinary frequency or urgency, no blood in the urine or stool.  PERTINENT  PMH / PSH: Chronic Migraine, Polysubstance Abuse, Tobacco use  OBJECTIVE:   BP 92/62   Pulse 77   Wt 111 lb 9.6 oz (50.6 kg)   SpO2 98%   BMI 17.48 kg/m   Gen: NAD Lumbar spine: - Inspection: no gross deformity or asymmetry, swelling or ecchymosis. No skin changes - Palpation: No TTP over the spinous processes, tightness, stiffness, and tenderness of the left lower paraspinal muscles, no pain in SI joints b/l - ROM: full active ROM of the lumbar spine in flexion and extension without pain - Strength: 5/5 strength of lower extremity in L4-S1 nerve root distributions b/l - Neuro: sensation intact in the L4-S1 nerve root distribution b/l, 2+ L4 and S1 reflexes   ASSESSMENT/PLAN:   Low back pain Acute, stable. Given symptoms and exam this is most likely an acute muscle strain with muscle spasm. No loss of groin sensation reported and no loss of urinary control. Patient requesting work note. - Cont conservative measures such as ice, heat, rest, no heavy lifting for one week and no bending over. - Baclofen 10mg  TID as needed for muscle spasm - Toradol shot given in clinic for acute pain relief - Work note given to be out till Monday     Venda Dice, [place]

## 2019-08-09 NOTE — Assessment & Plan Note (Signed)
Acute, stable. Given symptoms and exam this is most likely an acute muscle strain with muscle spasm. No loss of groin sensation reported and no loss of urinary control. Patient requesting work note. - Cont conservative measures such as ice, heat, rest, no heavy lifting for one week and no bending over. - Baclofen 10mg  TID as needed for muscle spasm - Toradol shot given in clinic for acute pain relief - Work note given to be out till Monday

## 2019-08-09 NOTE — Patient Instructions (Signed)
Acute Back Pain, Adult Acute back pain is sudden and usually short-lived. It is often caused by an injury to the muscles and tissues in the back. The injury may result from:  A muscle or ligament getting overstretched or torn (strained). Ligaments are tissues that connect bones to each other. Lifting something improperly can cause a back strain.  Wear and tear (degeneration) of the spinal disks. Spinal disks are circular tissue that provides cushioning between the bones of the spine (vertebrae).  Twisting motions, such as while playing sports or doing yard work.  A hit to the back.  Arthritis. You may have a physical exam, lab tests, and imaging tests to find the cause of your pain. Acute back pain usually goes away with rest and home care. Follow these instructions at home: Managing pain, stiffness, and swelling  Take over-the-counter and prescription medicines only as told by your health care provider.  Your health care provider may recommend applying ice during the first 24-48 hours after your pain starts. To do this: ? Put ice in a plastic bag. ? Place a towel between your skin and the bag. ? Leave the ice on for 20 minutes, 2-3 times a day.  If directed, apply heat to the affected area as often as told by your health care provider. Use the heat source that your health care provider recommends, such as a moist heat pack or a heating pad. ? Place a towel between your skin and the heat source. ? Leave the heat on for 20-30 minutes. ? Remove the heat if your skin turns bright red. This is especially important if you are unable to feel pain, heat, or cold. You have a greater risk of getting burned. Activity   Do not stay in bed. Staying in bed for more than 1-2 days can delay your recovery.  Sit up and stand up straight. Avoid leaning forward when you sit, or hunching over when you stand. ? If you work at a desk, sit close to it so you do not need to lean over. Keep your chin tucked  in. Keep your neck drawn back, and keep your elbows bent at a right angle. Your arms should look like the letter "L." ? Sit high and close to the steering wheel when you drive. Add lower back (lumbar) support to your car seat, if needed.  Take short walks on even surfaces as soon as you are able. Try to increase the length of time you walk each day.  Do not sit, drive, or stand in one place for more than 30 minutes at a time. Sitting or standing for long periods of time can put stress on your back.  Do not drive or use heavy machinery while taking prescription pain medicine.  Use proper lifting techniques. When you bend and lift, use positions that put less stress on your back: ? Bend your knees. ? Keep the load close to your body. ? Avoid twisting.  Exercise regularly as told by your health care provider. Exercising helps your back heal faster and helps prevent back injuries by keeping muscles strong and flexible.  Work with a physical therapist to make a safe exercise program, as recommended by your health care provider. Do any exercises as told by your physical therapist. Lifestyle  Maintain a healthy weight. Extra weight puts stress on your back and makes it difficult to have good posture.  Avoid activities or situations that make you feel anxious or stressed. Stress and anxiety increase muscle   tension and can make back pain worse. Learn ways to manage anxiety and stress, such as through exercise. General instructions  Sleep on a firm mattress in a comfortable position. Try lying on your side with your knees slightly bent. If you lie on your back, put a pillow under your knees.  Follow your treatment plan as told by your health care provider. This may include: ? Cognitive or behavioral therapy. ? Acupuncture or massage therapy. ? Meditation or yoga. Contact a health care provider if:  You have pain that is not relieved with rest or medicine.  You have increasing pain going down  into your legs or buttocks.  Your pain does not improve after 2 weeks.  You have pain at night.  You lose weight without trying.  You have a fever or chills. Get help right away if:  You develop new bowel or bladder control problems.  You have unusual weakness or numbness in your arms or legs.  You develop nausea or vomiting.  You develop abdominal pain.  You feel faint. Summary  Acute back pain is sudden and usually short-lived.  Use proper lifting techniques. When you bend and lift, use positions that put less stress on your back.  Take over-the-counter and prescription medicines and apply heat or ice as directed by your health care provider. This information is not intended to replace advice given to you by your health care provider. Make sure you discuss any questions you have with your health care provider. Document Revised: 07/04/2018 Document Reviewed: 10/27/2016 Elsevier Patient Education  2020 [employer]  

## 2019-12-14 ENCOUNTER — Emergency Department (HOSPITAL_COMMUNITY)
Admission: EM | Admit: 2019-12-14 | Discharge: 2019-12-14 | Disposition: A | Discharge status: Home / Self Care | Payer: Self-pay | Attending: Emergency Medicine | Admitting: Emergency Medicine

## 2019-12-14 ENCOUNTER — Other Ambulatory Visit: Payer: Self-pay

## 2019-12-14 ENCOUNTER — Emergency Department (HOSPITAL_COMMUNITY): Payer: Self-pay

## 2019-12-14 DIAGNOSIS — Z5321 Procedure and treatment not carried out due to patient leaving prior to being seen by health care provider: Secondary | ICD-10-CM | POA: Insufficient documentation

## 2019-12-14 DIAGNOSIS — R309 Painful micturition, unspecified: Secondary | ICD-10-CM | POA: Insufficient documentation

## 2019-12-14 DIAGNOSIS — R1031 Right lower quadrant pain: Secondary | ICD-10-CM | POA: Insufficient documentation

## 2019-12-14 LAB — CBC WITH DIFFERENTIAL/PLATELET
Abs Immature Granulocytes: 0.06 10*3/uL (ref 0.00–0.07)
Basophils Absolute: 0 10*3/uL (ref 0.0–0.1)
Basophils Relative: 0 %
Eosinophils Absolute: 0.1 10*3/uL (ref 0.0–0.5)
Eosinophils Relative: 1 %
HCT: 41.1 % (ref 36.0–46.0)
Hemoglobin: 13.8 g/dL (ref 12.0–15.0)
Immature Granulocytes: 1 %
Lymphocytes Relative: 9 %
Lymphs Abs: 1.2 10*3/uL (ref 0.7–4.0)
MCH: 33.2 pg (ref 26.0–34.0)
MCHC: 33.6 g/dL (ref 30.0–36.0)
MCV: 98.8 fL (ref 80.0–100.0)
Monocytes Absolute: 0.5 10*3/uL (ref 0.1–1.0)
Monocytes Relative: 4 %
Neutro Abs: 11.1 10*3/uL — ABNORMAL HIGH (ref 1.7–7.7)
Neutrophils Relative %: 85 %
Platelets: 234 10*3/uL (ref 150–400)
RBC: 4.16 MIL/uL (ref 3.87–5.11)
RDW: 13.2 % (ref 11.5–15.5)
WBC: 13 10*3/uL — ABNORMAL HIGH (ref 4.0–10.5)
nRBC: 0 % (ref 0.0–0.2)

## 2019-12-14 LAB — COMPREHENSIVE METABOLIC PANEL
ALT: 14 U/L (ref 0–44)
AST: 17 U/L (ref 15–41)
Albumin: 4.4 g/dL (ref 3.5–5.0)
Alkaline Phosphatase: 47 U/L (ref 38–126)
Anion gap: 9 (ref 5–15)
BUN: 9 mg/dL (ref 6–20)
CO2: 27 mmol/L (ref 22–32)
Calcium: 9.6 mg/dL (ref 8.9–10.3)
Chloride: 102 mmol/L (ref 98–111)
Creatinine, Ser: 0.94 mg/dL (ref 0.44–1.00)
GFR calc Af Amer: >60 mL/min (ref >60)
GFR calc non Af Amer: >60 mL/min (ref >60)
Glucose, Bld: 99 mg/dL (ref 70–99)
Potassium: 4.5 mmol/L (ref 3.5–5.1)
Sodium: 138 mmol/L (ref 135–145)
Total Bilirubin: 1.1 mg/dL (ref 0.3–1.2)
Total Protein: 6.8 g/dL (ref 6.5–8.1)

## 2019-12-14 LAB — I-STAT BETA HCG BLOOD, ED (MC, WL, AP ONLY): I-stat hCG, quantitative: <5 m[IU]/mL (ref <5)

## 2019-12-14 LAB — URINALYSIS, ROUTINE W REFLEX MICROSCOPIC
Bilirubin Urine: NEGATIVE
Glucose, UA: NEGATIVE mg/dL
Hgb urine dipstick: NEGATIVE
Ketones, ur: NEGATIVE mg/dL
Leukocytes,Ua: NEGATIVE
Nitrite: NEGATIVE
Protein, ur: NEGATIVE mg/dL
Specific Gravity, Urine: 1.021 (ref 1.005–1.030)
pH: 5 (ref 5.0–8.0)

## 2019-12-14 LAB — LIPASE, BLOOD: Lipase: 22 U/L (ref 11–51)

## 2019-12-14 MED ORDER — IOHEXOL 300 MG/ML  SOLN
100.0000 mL | Freq: Once | INTRAMUSCULAR | Status: AC | PRN
  Administered 2019-12-14: 100 mL via INTRAVENOUS

## 2019-12-14 MED ORDER — ONDANSETRON 4 MG PO TBDP: 4.0000 mg | ORAL_TABLET | Freq: Once | ORAL | Status: DC

## 2019-12-14 MED ORDER — HYDROCODONE-ACETAMINOPHEN 5-325 MG PO TABS: 1.0000 | ORAL_TABLET | Freq: Once | ORAL | Status: DC

## 2019-12-14 NOTE — ED Triage Notes (Signed)
C/o right lower abdominal quadrant pain since yesterday; some painful urination; stated last menstrual cycle towards the end of August.

## 2019-12-14 NOTE — ED Notes (Signed)
Pt was encouraged stay and told risk of leaving. Pt said she could no longer wait she had 3 kids at home waiting.

## 2019-12-14 NOTE — ED Triage Notes (Signed)
Emergency Medicine Provider Triage Evaluation Note  Kimberly Bradley , a 29 y.o. female  was evaluated in triage.  Pt complains of right lower quadrant abdominal pain.  Worse with movement.  No fevers or chills.  No nausea vomiting, diarrhea, constipation.  Normal urination.  History of hernia repair and tubal ligation.  Review of Systems  Positive: Abdominal pain Negative: Fevers, chills, nausea, vomiting, diarrhea, constipation  Physical Exam  BP 103/60 (BP Location: Left Arm)   Pulse 61   Temp 99.4 F (37.4 C) (Oral)   Resp 18   SpO2 100%  Gen:   Awake, no distress, appears uncomfortable due to pain HEENT:  Atraumatic  Resp:  Normal effort  Cardiac:  Normal rate  Abd:   TTP of right lower quadrant abdomen.  No tenderness palpation also in the abdomen.  No CVA tenderness.  Negative rebound. MSK:   Moves extremities without difficulty  Neuro:  Speech clear   Medical Decision Making  Medically screening exam initiated at 3:41 PM.  Appropriate orders placed.  Kimberly Bradley was informed that the remainder of the evaluation will be completed by another provider, this initial triage assessment does not replace that evaluation, and the importance of remaining in the ED until their evaluation is complete.  Clinical Impression   Patient resenting for evaluation of right lower quadrant abdominal pain.  Consider appy versus torsion versus TOA versus kidney stone.  Labs, CT abdomen pelvis, and pain medicine ordered.   Franchot Heidelberg, PA-C 12/14/19 1542

## 2019-12-15 ENCOUNTER — Encounter (HOSPITAL_COMMUNITY): Payer: Self-pay | Admitting: *Deleted

## 2019-12-15 ENCOUNTER — Ambulatory Visit (HOSPITAL_COMMUNITY)
Admission: EM | Admit: 2019-12-15 | Discharge: 2019-12-15 | Disposition: A | Discharge status: Home / Self Care | Payer: Medicaid Other | Attending: Family Medicine | Admitting: Family Medicine

## 2019-12-15 ENCOUNTER — Other Ambulatory Visit: Payer: Self-pay

## 2019-12-15 DIAGNOSIS — D251 Intramural leiomyoma of uterus: Secondary | ICD-10-CM | POA: Insufficient documentation

## 2019-12-15 DIAGNOSIS — K529 Noninfective gastroenteritis and colitis, unspecified: Secondary | ICD-10-CM | POA: Insufficient documentation

## 2019-12-15 DIAGNOSIS — R102 Pelvic and perineal pain: Secondary | ICD-10-CM | POA: Insufficient documentation

## 2019-12-15 MED ORDER — AZITHROMYCIN 500 MG PO TABS: 500.0000 mg | ORAL_TABLET | Freq: Every day | ORAL | 0 refills | Status: DC

## 2019-12-15 MED ORDER — FAMOTIDINE 20 MG PO TABS: 20.0000 mg | ORAL_TABLET | Freq: Two times a day (BID) | ORAL | 0 refills | Status: DC

## 2019-12-15 MED ORDER — ONDANSETRON HCL 4 MG PO TABS: 4.0000 mg | ORAL_TABLET | Freq: Four times a day (QID) | ORAL | 0 refills | Status: DC

## 2019-12-15 NOTE — ED Provider Notes (Signed)
[place]    CSN: [id number] Arrival date & time: 12/15/19  1035      History   Chief Complaint Chief Complaint  Patient presents with  . Abdominal Pain    HPI Kimberly Bradley is a 29 y.o. female.   HPI  Patient presents today following presenting to the ER with a complaint of right lower abdominal pain.  While at the ER patient had a complete battery of labs completed along with a CT of abdomen and pelvis.  CT was concerning for enteritis and new finding of a intramural fibroid.  Patient continues to endorse some nausea without vomiting, and lower abdominal pain.  CBC was slightly elevated.  Patient is afebrile.  Patient has had a history of abnormal Pap smears and has not seen a gynecologist in several years.  She is requesting STD testing today due to some thickness of her vaginal discharge however denies odor, lesions, or concern for pregnancy.  She started her menstrual cycle since being here in the clinic today.  She also is currently not having any diarrhea. Past Medical History:  Diagnosis Date  . Headache(784.0)   . SVD (spontaneous vaginal delivery)    x 3    Patient Active Problem List   Diagnosis Date Noted  . Low back pain 08/09/2019  . Chronic migraine without aura with status migrainosus, not intractable 09/19/2017  . Acute pain of left knee 09/19/2017  . Surgical history of tubal ligation 09/19/2017  . AP (abdominal pain) 08/11/2015  . Headache 04/04/2015  . Abdominal pain, left lower quadrant 07/13/2013  . Sterilization 05/03/2012  . Small for gestational age 53/16/2013  . Supervision of normal subsequent pregnancy 11/17/2011  . Loss of hearing 05/18/2011  . Abdominal cramping 09/18/2010  . UMBILICAL HERNIA 28/31/5176  . 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 ABNORMAL PAP SMEAR, LGSIL 12/27/2007  . ABORTION, SPONTANEOUS 10/03/2007  . OVARIAN CYST, RIGHT 09/08/2007  . TOBACCO USE, QUIT 05/26/2006    Past Surgical History:  Procedure  Laterality Date  . HERNIA REPAIR  2010  . LAPAROSCOPIC TUBAL LIGATION  05/03/2012   Procedure: LAPAROSCOPIC TUBAL LIGATION;  Surgeon: Lavonia Drafts, MD;  Location: [facility];  Service: Gynecology;  Laterality: N/A;    OB History    Gravida  4   Para  3   Term  3   Preterm      AB  1   Living  3     SAB  1   TAB      Ectopic      Multiple      Live Births  3            Home Medications    Prior to Admission medications   Medication Sig Start Date End Date Taking? Authorizing Provider  baclofen (LIORESAL) 10 MG tablet Take 1 tablet (10 mg total) by mouth 3 (three) times daily. 08/09/19   Lockamy, Christia Reading, DO  fluticasone (FLONASE) 50 MCG/ACT nasal spray Place 2 sprays into both nostrils daily for 7 days. Then as needed for sinus pain and inflammation 06/19/18 06/26/18  Rodriguez-Southworth, Sunday Spillers, PA-C  ibuprofen (ADVIL,MOTRIN) 200 MG tablet Take 200 mg by mouth every 6 (six) hours as needed for headache. 3 prn headaches    [provider]  rizatriptan (MAXALT-MLT) 5 MG disintegrating tablet Take 1 tablet (5 mg total) by mouth as needed for migraine. May repeat in 2 hours if needed 06/19/18   Rodriguez-Southworth, Sunday Spillers, PA-C    Family History  Family History  Problem Relation Age of Onset  . Cancer Mother     Social History Social History   Tobacco Use  . Smoking status: Current Every Day Smoker    Packs/day: 0.50    Years: 6.00    Pack years: 3.00    Types: Cigarettes    Last attempt to quit: 11/12/2011    Years since quitting: 8.0  . Smokeless tobacco: Never Used  Vaping Use  . Vaping Use: Never used  Substance Use Topics  . Alcohol use: Not Currently  . Drug use: Not Currently    Types: Cocaine     Allergies   Tramadol hcl and Codeine   Review of Systems Review of Systems Pertinent negatives listed in HPI Physical Exam Triage Vital Signs ED Triage Vitals  Enc Vitals Group     BP 12/15/19 1118 123/73     Pulse Rate  12/15/19 1118 63     Resp 12/15/19 1118 16     Temp 12/15/19 1118 98.2 F (36.8 C)     Temp Source 12/15/19 1118 Oral     SpO2 12/15/19 1118 99 %     Weight --      Height --      Head Circumference --      Peak Flow --      Pain Score 12/15/19 1119 6     Pain Loc --      Pain Edu? --      Excl. in [place]? --    No data found.  Updated Vital Signs BP 123/73   Pulse 63   Temp 98.2 F (36.8 C) (Oral)   Resp 16   LMP 11/21/2019 (Approximate)   SpO2 99%   Visual Acuity Right Eye Distance:   Left Eye Distance:   Bilateral Distance:    Right Eye Near:   Left Eye Near:    Bilateral Near:     Physical Exam Constitutional:      Appearance: She is well-developed.  HENT:     Head: Normocephalic.  Cardiovascular:     Rate and Rhythm: Normal rate and regular rhythm.  Pulmonary:     Effort: Pulmonary effort is normal.     Breath sounds: Normal breath sounds.  Abdominal:     General: Bowel sounds are normal.     Tenderness: There is abdominal tenderness in the right lower quadrant and suprapubic area. There is no right CVA tenderness or rebound. Positive signs include Rovsing's sign.     Hernia: No hernia is present.  Skin:    General: Skin is warm and dry.     Capillary Refill: Capillary refill takes less than 2 seconds.  Neurological:     Mental Status: She is alert.    UC Treatments / Results  Labs (all labs ordered are listed, but only abnormal results are displayed) Labs Reviewed - No data to display  EKG   Radiology CT ABDOMEN PELVIS W CONTRAST  Result Date: 12/14/2019 CLINICAL DATA:  Right lower quadrant abdominal pain EXAM: CT ABDOMEN AND PELVIS WITH CONTRAST TECHNIQUE: Multidetector CT imaging of the abdomen and pelvis was performed using the standard protocol following bolus administration of intravenous contrast. CONTRAST:  162mL OMNIPAQUE IOHEXOL 300 MG/ML  SOLN COMPARISON:  CT 05/28/2007 FINDINGS: Lower chest: Lung bases are clear. Normal heart size. No  pericardial effusion. Hepatobiliary: No worrisome focal liver lesions. Smooth liver surface contour. Normal hepatic attenuation. Normal gallbladder and biliary tree. Pancreas: Unremarkable. No pancreatic ductal dilatation or surrounding  inflammatory changes. Spleen: Normal in size. No concerning splenic lesions. Adrenals/Urinary Tract: Normal adrenal glands. Kidneys are normally located with symmetric enhancementand some early excretion suggesting excellent renal function. No suspicious renal lesion, urolithiasis or hydronephrosis. Urinary bladder is largely decompressed at the time of exam and therefore poorly evaluated by CT imaging. No gross bladder abnormality. Stomach/Bowel: Distal esophagus, stomach and duodenal sweep are unremarkable. Few questionably thickened, fluid-filled loops of distal small bowel seen in the low mid abdomen. No evidence of obstruction. A normal appendix is seen coursing in a retrocecal fashion along the right psoas. No periappendiceal inflammation. No colonic dilatation or wall thickening. Vascular/Lymphatic: No significant vascular findings are present. No enlarged abdominal or pelvic lymph nodes. Reproductive: Chronic 3.4 cm simple appearing cystic lesion in the left adnexa, unchanged since 2009 favoring benign entity. Retroflexed uterus. No concerning adnexal lesions. Suspect a small intramural fibroid along the anterior uterine fundus. Other: No abdominopelvic free air or fluid. No bowel containing hernias. Musculoskeletal: Transitional lumbosacral vertebrae. No acute osseous abnormality or suspicious osseous lesion. Musculature appears normal and symmetric. IMPRESSION: 1. Few questionably thickened, fluid-filled loops of distal small bowel seen in the low mid abdomen, could reflect a mild enteritis. 2. Normal appendix. 3. Chronic 3.4 cm simple appearing cystic lesion in the left adnexa, unchanged since 2009 favoring benign entity. 4. Retroflexed uterus. Suspect a small intramural  fibroid along the anterior uterine fundus. Electronically Signed   By: Lovena Le M.D.   On: 12/14/2019 20:18    Procedures Procedures (including critical care time)  Medications Ordered in UC Medications - No data to display  Initial Impression / Assessment and Plan / UC Course  I have reviewed the triage vital signs and the nursing notes.  Pertinent labs & imaging results that were available during my care of the patient were reviewed by me and considered in my medical decision making (see chart for details).    Patient presented to the ER yesterday however left without being seen given the extended wait time. She did have a CT of the abdomen pelvis completed which revealed a intramural fibroid and abdomen suspicious for enteritis. CBC count mildly elevated. Will treat with azithromycin 500 mg x 1 dose to cover for an acute gastroenteritis. This also provides STD coverage if pelvic pain is related to an STD. Patient given information to follow-up with Center for [facility]. Strict follow-up cautions given if symptoms worsen..  Final Clinical Impressions(s) / UC Diagnoses   Final diagnoses:  Enteritis  Pelvic pain in female  Intramural leiomyoma of uterus-see CT 12/14/19     Discharge Instructions     STD test results will be available within 3 to 5 days and will upload immediately to your MyChart.  You will be contacted regarding any abnormal results.  [facility] as you are overdue for repeat Pap smear and for further evaluation of the fibroid found on your CT scan while in the ER yesterday.  I am treating you with azithromycin for treatment of acute gastroenteritis.  Start Pepcid twice daily as this will help with some of the abdominal pain that you are experiencing.  Avoid taking any NSAIDs such as ibuprofen or naproxen as this could worsen abdominal symptoms.  Lower pelvic pain is possibly referred from gastroenteritis and or could be related to  finding of a fibroid.  Highly recommend follow-up and further evaluation by gynecology.    ED Prescriptions    Medication Sig Dispense Auth. Provider  azithromycin (ZITHROMAX) 500 MG tablet Take 1 tablet (500 mg total) by mouth daily. 1 tablet Scot Jun, FNP   famotidine (PEPCID) 20 MG tablet Take 1 tablet (20 mg total) by mouth 2 (two) times daily. 30 tablet Scot Jun, FNP   ondansetron (ZOFRAN) 4 MG tablet Take 1 tablet (4 mg total) by mouth every 6 (six) hours. 12 tablet Scot Jun, FNP     PDMP not reviewed this encounter.   Scot Jun, FNP 12/15/19 [phone]

## 2019-12-15 NOTE — Discharge Instructions (Signed)
STD test results will be available within 3 to 5 days and will upload immediately to your MyChart.  You will be contacted regarding any abnormal results.  [facility] as you are overdue for repeat Pap smear and for further evaluation of the fibroid found on your CT scan while in the ER yesterday.  I am treating you with azithromycin for treatment of acute gastroenteritis.  Start Pepcid twice daily as this will help with some of the abdominal pain that you are experiencing.  Avoid taking any NSAIDs such as ibuprofen or naproxen as this could worsen abdominal symptoms.  Lower pelvic pain is possibly referred from gastroenteritis and or could be related to finding of a fibroid.  Highly recommend follow-up and further evaluation by gynecology.

## 2019-12-15 NOTE — ED Triage Notes (Signed)
Pt c/o constant RLQ pain over past 3 days with some nausea.  Denies fevers.  Was seen in ED yesterday, but left after having CT scan, labs done due to long wait. C/O vaginal discharge x 3 days. Pt ambulates with guarded gait.

## 2019-12-17 LAB — CERVICOVAGINAL ANCILLARY ONLY
Bacterial Vaginitis (gardnerella): POSITIVE — AB
Candida Glabrata: NEGATIVE
Candida Vaginitis: NEGATIVE
Chlamydia: NEGATIVE
Comment: NEGATIVE
Comment: NEGATIVE
Comment: NEGATIVE
Comment: NEGATIVE
Comment: NEGATIVE
Comment: NORMAL
Neisseria Gonorrhea: NEGATIVE
Trichomonas: NEGATIVE

## 2019-12-19 ENCOUNTER — Telehealth (HOSPITAL_COMMUNITY): Payer: Self-pay | Admitting: Emergency Medicine

## 2019-12-19 MED ORDER — METRONIDAZOLE 500 MG PO TABS: 500.0000 mg | ORAL_TABLET | Freq: Two times a day (BID) | ORAL | 0 refills | Status: DC

## 2019-12-20 ENCOUNTER — Encounter: Payer: Self-pay | Admitting: Family Medicine

## 2019-12-20 ENCOUNTER — Other Ambulatory Visit: Payer: Self-pay

## 2019-12-20 ENCOUNTER — Ambulatory Visit (INDEPENDENT_AMBULATORY_CARE_PROVIDER_SITE_OTHER): Payer: Self-pay | Admitting: Family Medicine

## 2019-12-20 DIAGNOSIS — R1031 Right lower quadrant pain: Secondary | ICD-10-CM | POA: Insufficient documentation

## 2019-12-20 MED ORDER — POLYETHYLENE GLYCOL 3350 17 GM/SCOOP PO POWD: 17.0000 g | Freq: Every day | ORAL | 0 refills | Status: DC

## 2019-12-20 NOTE — Progress Notes (Signed)
    SUBJECTIVE:   CHIEF COMPLAINT / HPI:    Right lower abdominal pain She reports that she has been having ongoing right lower abdominal pain for about the past week.  Is a moderate abdominal pain but does prevent her from working.  This right lower abdominal pain is associated with nausea.  She has not had any episodes of emesis in the past several days.  She denies loose stools.  She does feel she has been straining to defecate and has been having bowel movements roughly every other day.  She denies dysuria although does feel that she has been urinating frequently.  Occasional, mild pain with intercourse.  She also notes a thin white not malodorous vaginal discharge.  Kimberly Bradley was last seen at urgent care 5 days ago for this issue. Her work-up at urgent care was notable for negative pregnancy test, a vaginal swab which was positive for bacterial vaginosis, and a CT abdomen which showed no evidence of appendicitis, she does have a small uterine fibroid and a stable left ovarian cyst.  At urgent care, she was treated for gastroenteritis with azithromycin.  Since she was seen in urgent care, she notes that her symptoms have remained entirely stable.  She has not worsened nor has she improved.  PERTINENT  PMH / PSH: History of tubal ligation, umbilical hernia  OBJECTIVE:   BP 104/60   Pulse 62   Ht 5\' 7"  (1.702 m)   Wt 115 lb 6.4 oz (52.3 kg)   LMP 12/15/2019 (Exact Date)   SpO2 98%   BMI 18.07 kg/m    General: Alert and cooperative and appears to be in no acute distress HEENT: Neck non-tender without lymphadenopathy, masses or thyromegaly Cardio: Normal S1 and S2, no S3 or S4. Rhythm is regular. No murmurs or rubs.   Pulm: Clear to auscultation bilaterally, no crackles, wheezing, or diminished breath sounds. Normal respiratory effort Abdomen: Bowel sounds normal. Abdomen soft and only mildly tender to palpation in the right lower quadrant.  No significant tenderness over McBurney's  point.  Not peritoneal. Extremities: No peripheral edema. Warm/ well perfused.  Strong radial pulse. Neuro: Cranial nerves grossly intact   ASSESSMENT/PLAN:   Right lower quadrant abdominal pain No acute abdomen.  CT shows no evidence of appendicitis.  Pregnancy test was negative at urgent care and she is previously had a tubal ligation.  No suspicion for ectopic pregnancy at this time.  Very low suspicion for ovarian torsion or ruptured cyst based on CT imaging.  Diverticulitis would be common at this location though it is possible.  Gastroenteritis is possible although she does not seem to be having any loose stools Allin may in fact be slightly constipated.  Constipation may be contributing to her discomfort.  She has not yet been treated for the BV noted on her vaginal swab from urgent care.  For now, we will treat her BV and mild constipation and monitor for improvement.  She was encouraged to return to clinic if she has not had significant improvement in 1 week. -BV treatment sent in previously by urgent care (not yet started at the time of the visit) -Start MiraLAX daily for constipation -Follow-up in 1 week if no improvement     Matilde Haymaker, MD [place]

## 2019-12-20 NOTE — Assessment & Plan Note (Signed)
No acute abdomen.  CT shows no evidence of appendicitis.  Pregnancy test was negative at urgent care and she is previously had a tubal ligation.  No suspicion for ectopic pregnancy at this time.  Very low suspicion for ovarian torsion or ruptured cyst based on CT imaging.  Diverticulitis would be common at this location though it is possible.  Gastroenteritis is possible although she does not seem to be having any loose stools Allin may in fact be slightly constipated.  Constipation may be contributing to her discomfort.  She has not yet been treated for the BV noted on her vaginal swab from urgent care.  For now, we will treat her BV and mild constipation and monitor for improvement.  She was encouraged to return to clinic if she has not had significant improvement in 1 week. -BV treatment sent in previously by urgent care (not yet started at the time of the visit) -Start MiraLAX daily for constipation -Follow-up in 1 week if no improvement

## 2019-12-20 NOTE — Patient Instructions (Signed)
Right lower abdominal pain: I am sorry that you are still having this abdominal pain.  For now, I think it would be reasonable to treat your bacterial vaginosis and continue to monitor for improvement.  I recommend he also start taking MiraLAX daily which will help if constipation is also contributing.  Please come back if this is still persistent once you finish your antibiotics.

## 2020-01-31 ENCOUNTER — Encounter: Payer: Medicaid Other | Admitting: Obstetrics and Gynecology

## 2020-12-31 ENCOUNTER — Encounter (HOSPITAL_COMMUNITY): Payer: Self-pay

## 2020-12-31 ENCOUNTER — Other Ambulatory Visit: Payer: Self-pay

## 2020-12-31 ENCOUNTER — Ambulatory Visit (HOSPITAL_COMMUNITY)
Admission: EM | Admit: 2020-12-31 | Discharge: 2020-12-31 | Disposition: A | Discharge status: Home / Self Care | Payer: Medicaid Other | Attending: Internal Medicine | Admitting: Internal Medicine

## 2020-12-31 DIAGNOSIS — R112 Nausea with vomiting, unspecified: Secondary | ICD-10-CM

## 2020-12-31 MED ORDER — ONDANSETRON 4 MG PO TBDP
4.0000 mg | ORAL_TABLET | Freq: Once | ORAL | Status: AC
  Administered 2020-12-31: 4 mg via ORAL

## 2020-12-31 MED ORDER — ONDANSETRON HCL 8 MG PO TABS
8.0000 mg | ORAL_TABLET | Freq: Three times a day (TID) | ORAL | 0 refills | Status: AC | PRN
Start: 2020-12-31 — End: 2021-01-05

## 2020-12-31 MED ORDER — ONDANSETRON 4 MG PO TBDP
ORAL_TABLET | ORAL | Status: AC
  Filled 2020-12-31: qty 1

## 2020-12-31 NOTE — Discharge Instructions (Signed)
Please take Zofran tablets 3 times daily as needed.  Reintroduce foods slowly with single item small meals and electrolyte replacement fluids such as Gatorade or Pedialyte.  If your nausea and vomiting persists for 3-4 more days, please report to the emergency room as you may require IV rehydration.

## 2020-12-31 NOTE — ED Triage Notes (Signed)
Pt presents with emesis x 2 days

## 2020-12-31 NOTE — ED Provider Notes (Signed)
[place]    CSN: [id number] Arrival date & time: 12/31/20  1734      History   Chief Complaint Chief Complaint  Patient presents with   Emesis    HPI Kimberly Bradley is a 30 y.o. female.   Patient complains of a 2-hour history of nausea and vomiting.  States yesterday she threw up everything she ate.  States she has not eaten much today, has only been drinking water and she has been through that as well.  Patient has not tried any medication for this.  Patient was given Zofran p.o. arrival to the office, states she is feeling better at this time.  Patient requested note for work.  Patient denies fever, aches, chills, diarrhea, headache, body ache, sore throat, cough, congestion.  The history is provided by the patient.   Past Medical History:  Diagnosis Date   Headache(784.0)    SVD (spontaneous vaginal delivery)    x 3    Patient Active Problem List   Diagnosis Date Noted   Right lower quadrant abdominal pain 12/20/2019   Low back pain 08/09/2019   Chronic migraine without aura with status migrainosus, not intractable 09/19/2017   Acute pain of left knee 09/19/2017   Surgical history of tubal ligation 09/19/2017   Headache 04/04/2015   Sterilization 05/03/2012   Small for gestational age 57/16/2013   Supervision of normal subsequent pregnancy 11/17/2011   Loss of hearing 05/18/2011   Abdominal cramping 24/40/1027   UMBILICAL HERNIA 25/36/6440   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   ABNORMAL PAP SMEAR, LGSIL 12/27/2007   ABORTION, SPONTANEOUS 10/03/2007   OVARIAN CYST, RIGHT 09/08/2007   TOBACCO USE, QUIT 05/26/2006    Past Surgical History:  Procedure Laterality Date   HERNIA REPAIR  2010   LAPAROSCOPIC TUBAL LIGATION  05/03/2012   Procedure: LAPAROSCOPIC TUBAL LIGATION;  Surgeon: Lavonia Drafts, MD;  Location: [facility];  Service: Gynecology;  Laterality: N/A;    OB History     Gravida  4   Para  3   Term  3   Preterm      AB  1    Living  3      SAB  1   IAB      Ectopic      Multiple      Live Births  3            Home Medications    Prior to Admission medications   Medication Sig Start Date End Date Taking? Authorizing Provider  ondansetron (ZOFRAN) 8 MG tablet Take 1 tablet (8 mg total) by mouth every 8 (eight) hours as needed for up to 5 days for nausea or vomiting. 12/31/20 01/05/21 Yes Lynden Oxford Scales, PA-C    Family History Family History  Problem Relation Age of Onset   Cancer Mother     Social History Social History   Tobacco Use   Smoking status: Every Day    Packs/day: 0.50    Years: 6.00    Pack years: 3.00    Types: Cigarettes    Last attempt to quit: 11/12/2011    Years since quitting: 9.1   Smokeless tobacco: Never  Vaping Use   Vaping Use: Never used  Substance Use Topics   Alcohol use: Not Currently   Drug use: Not Currently    Types: Cocaine     Allergies   Tramadol hcl and Codeine   Review of Systems Review of Systems Pertinent findings noted in history  of present illness.    Physical Exam Triage Vital Signs ED Triage Vitals  Enc Vitals Group     BP 12/31/20 1828 112/70     Pulse Rate 12/31/20 1828 (!) 56     Resp 12/31/20 1828 12     Temp 12/31/20 1828 98.2 F (36.8 C)     Temp Source 12/31/20 1828 Oral     SpO2 12/31/20 1828 98 %     Weight --      Height --      Head Circumference --      Peak Flow --      Pain Score 12/31/20 1830 2     Pain Loc --      Pain Edu? --      Excl. in [place]? --    No data found.  Updated Vital Signs BP 112/70 (BP Location: Right Arm)   Pulse (!) 56   Temp 98.2 F (36.8 C) (Oral)   Resp 12   SpO2 98%   Visual Acuity Right Eye Distance:   Left Eye Distance:   Bilateral Distance:    Right Eye Near:   Left Eye Near:    Bilateral Near:     Physical Exam Vitals and nursing note reviewed.  Constitutional:      Appearance: Normal appearance.  HENT:     Head: Normocephalic and atraumatic.   Cardiovascular:     Rate and Rhythm: Normal rate and regular rhythm.     Pulses: Normal pulses.     Heart sounds: Normal heart sounds.  Pulmonary:     Effort: Pulmonary effort is normal.     Breath sounds: Normal breath sounds.  Abdominal:     General: Abdomen is flat. Bowel sounds are normal. There is no distension.     Palpations: Abdomen is soft.     Tenderness: There is no abdominal tenderness. There is no right CVA tenderness, left CVA tenderness, guarding or rebound.  Musculoskeletal:        General: Normal range of motion.     Cervical back: Normal range of motion and neck supple.  Skin:    General: Skin is warm and dry.  Neurological:     General: No focal deficit present.     Mental Status: She is alert and oriented to person, place, and time. Mental status is at baseline.  Psychiatric:        Mood and Affect: Mood normal.        Behavior: Behavior normal.     UC Treatments / Results  Labs (all labs ordered are listed, but only abnormal results are displayed) Labs Reviewed - No data to display  EKG   Radiology No results found.  Procedures Procedures (including critical care time)  Medications Ordered in UC Medications  ondansetron (ZOFRAN-ODT) disintegrating tablet 4 mg (4 mg Oral Given 12/31/20 1833)    Initial Impression / Assessment and Plan / UC Course  I have reviewed the triage vital signs and the nursing notes.  Pertinent labs & imaging results that were available during my care of the patient were reviewed by me and considered in my medical decision making (see chart for details).     Patient complains of nausea and vomiting, total 4 episodes of vomiting.  Patient p.o. intake is limited to water today.  Patient reports feeling better after taking Zofran.  Patient requesting a note for work. Patient verbalized understanding and agreement of plan as discussed.  All questions were addressed during visit.  Final Clinical Impressions(s) / UC  Diagnoses   Final diagnoses:  Nausea and vomiting, unspecified vomiting type     Discharge Instructions      Please take Zofran tablets 3 times daily as needed.  Reintroduce foods slowly with single item small meals and electrolyte replacement fluids such as Gatorade or Pedialyte.  If your nausea and vomiting persists for 3-4 more days, please report to the emergency room as you may require IV rehydration.     ED Prescriptions     Medication Sig Dispense Auth. Provider   ondansetron (ZOFRAN) 8 MG tablet Take 1 tablet (8 mg total) by mouth every 8 (eight) hours as needed for up to 5 days for nausea or vomiting. 15 tablet Lynden Oxford Scales, PA-C      PDMP not reviewed this encounter.   Lynden Oxford Scales, PA-C 12/31/20 1900

## 2021-09-01 ENCOUNTER — Encounter: Payer: Self-pay | Admitting: *Deleted

## 2022-09-03 ENCOUNTER — Emergency Department
Admission: EM | Admit: 2022-09-03 | Discharge: 2022-09-03 | Disposition: A | Discharge status: Home / Self Care | Payer: 59 | Attending: Emergency Medicine | Admitting: Emergency Medicine

## 2022-09-03 ENCOUNTER — Other Ambulatory Visit: Payer: Self-pay

## 2022-09-03 ENCOUNTER — Emergency Department: Payer: 59

## 2022-09-03 ENCOUNTER — Encounter: Payer: Self-pay | Admitting: Emergency Medicine

## 2022-09-03 DIAGNOSIS — R1031 Right lower quadrant pain: Secondary | ICD-10-CM | POA: Diagnosis not present

## 2022-09-03 DIAGNOSIS — R102 Pelvic and perineal pain: Secondary | ICD-10-CM

## 2022-09-03 DIAGNOSIS — N760 Acute vaginitis: Secondary | ICD-10-CM | POA: Diagnosis not present

## 2022-09-03 DIAGNOSIS — B9689 Other specified bacterial agents as the cause of diseases classified elsewhere: Secondary | ICD-10-CM | POA: Diagnosis not present

## 2022-09-03 LAB — CBC
HCT: 38.7 % (ref 36.0–46.0)
Hemoglobin: 13.4 g/dL (ref 12.0–15.0)
MCH: 33.4 pg (ref 26.0–34.0)
MCHC: 34.6 g/dL (ref 30.0–36.0)
MCV: 96.5 fL (ref 80.0–100.0)
Platelets: 211 10*3/uL (ref 150–400)
RBC: 4.01 MIL/uL (ref 3.87–5.11)
RDW: 11.9 % (ref 11.5–15.5)
WBC: 6.4 10*3/uL (ref 4.0–10.5)
nRBC: 0 % (ref 0.0–0.2)

## 2022-09-03 LAB — URINALYSIS, ROUTINE W REFLEX MICROSCOPIC
Bilirubin Urine: NEGATIVE
Glucose, UA: NEGATIVE mg/dL
Ketones, ur: NEGATIVE mg/dL
Leukocytes,Ua: NEGATIVE
Nitrite: NEGATIVE
Protein, ur: NEGATIVE mg/dL
Specific Gravity, Urine: 1.009 (ref 1.005–1.030)
pH: 6 (ref 5.0–8.0)

## 2022-09-03 LAB — POC URINE PREG, ED: Preg Test, Ur: NEGATIVE

## 2022-09-03 LAB — CHLAMYDIA/NGC RT PCR (ARMC ONLY)
Chlamydia Tr: NOT DETECTED
N gonorrhoeae: NOT DETECTED

## 2022-09-03 LAB — COMPREHENSIVE METABOLIC PANEL
ALT: 16 U/L (ref 0–44)
AST: 17 U/L (ref 15–41)
Albumin: 4.4 g/dL (ref 3.5–5.0)
Alkaline Phosphatase: 47 U/L (ref 38–126)
Anion gap: 5 (ref 5–15)
BUN: 9 mg/dL (ref 6–20)
CO2: 25 mmol/L (ref 22–32)
Calcium: 9 mg/dL (ref 8.9–10.3)
Chloride: 108 mmol/L (ref 98–111)
Creatinine, Ser: 0.84 mg/dL (ref 0.44–1.00)
GFR, Estimated: >60 mL/min (ref >60)
Glucose, Bld: 107 mg/dL — ABNORMAL HIGH (ref 70–99)
Potassium: 4.1 mmol/L (ref 3.5–5.1)
Sodium: 138 mmol/L (ref 135–145)
Total Bilirubin: 1.1 mg/dL (ref 0.3–1.2)
Total Protein: 6.8 g/dL (ref 6.5–8.1)

## 2022-09-03 LAB — WET PREP, GENITAL
Sperm: NONE SEEN
Trich, Wet Prep: NONE SEEN
WBC, Wet Prep HPF POC: >=10 — AB (ref <10)
Yeast Wet Prep HPF POC: NONE SEEN

## 2022-09-03 LAB — HCG, QUANTITATIVE, PREGNANCY: hCG, Beta Chain, Quant, S: <1 m[IU]/mL (ref <5)

## 2022-09-03 LAB — LIPASE, BLOOD: Lipase: 23 U/L (ref 11–51)

## 2022-09-03 MED ORDER — METRONIDAZOLE 500 MG PO TABS: 500.0000 mg | ORAL_TABLET | Freq: Two times a day (BID) | ORAL | 0 refills | Status: AC

## 2022-09-03 MED ORDER — OXYCODONE HCL 5 MG PO TABS
5.0000 mg | ORAL_TABLET | Freq: Once | ORAL | Status: AC
  Administered 2022-09-03: 5 mg via ORAL
  Filled 2022-09-03: qty 1

## 2022-09-03 MED ORDER — ACETAMINOPHEN 500 MG PO TABS
1000.0000 mg | ORAL_TABLET | Freq: Once | ORAL | Status: AC
  Administered 2022-09-03: 1000 mg via ORAL
  Filled 2022-09-03: qty 2

## 2022-09-03 NOTE — Discharge Instructions (Addendum)
Your workup was so far.  Other than bacterial vaginosis which we started you on antibiotic for.  Do not drink alcohol with this.  We discussed CT imaging to rule out your appendix given no obvious cause for your symptoms but at this time you have opted to hold off and monitor your symptoms.  I will be here tomorrow 7-3 can was return if your pain is worsening you develop fevers nausea, vomiting or any other concerns otherwise this could be related to your uterus, cyst given you have had similar pain previously.  Please call OB/GYN number to make a follow-up appointment and return to the ER for worsening symptoms  IMPRESSION:  1. Junctional zone hyperechoic foci in the uterus suggesting  adenomyosis.  2. Normal endometrial stripe thickness.  3. Circumscribed hyperechoic 1.6 cm diameter lesion in the left  ovary likely representing a small dermoid or hemorrhagic cyst.  Otherwise, no abnormal adnexal masses.  4. Small amount of free fluid is likely physiologic.

## 2022-09-03 NOTE — ED Provider Notes (Signed)
[facility] Provider Note    Event Date/Time   First MD Initiated Contact with Patient 09/03/22 1159     (approximate)   History   Abdominal Pain   HPI  Kimberly Bradley is a 32 y.o. female who comes with concerns for right-sided pain that is been going on for the past 3 days.  Patient reports intermittently have right lower quadrant pain but then more recently has become more constant over the past day.  She reports a history of cyst but denies any cyst ruptures.  Does report a history of tubal ligation.  She has had 3 live births denies any history of ectopic pregnancy.  She reports being with 1 partner who is her boyfriend that she is been with for 2 years in a monogamous relationship.  Denies any concerns for foreign bodies or concerns for vaginal discharge or STDs.  She denies any fevers, vomiting.  Does still have her appendix.  Physical Exam   Triage Vital Signs: ED Triage Vitals  Enc Vitals Group     BP 09/03/22 1145 (!) 114/55     Pulse Rate 09/03/22 1145 60     Resp 09/03/22 1145 17     Temp 09/03/22 1145 98.4 F (36.9 C)     Temp Source 09/03/22 1145 Oral     SpO2 09/03/22 1145 100 %     Weight 09/03/22 1150 120 lb (54.4 kg)     Height 09/03/22 1212 5\' 7"  (1.702 m)     Head Circumference --      Peak Flow --      Pain Score 09/03/22 1150 7     Pain Loc --      Pain Edu? --      Excl. in GC? --     Most recent vital signs: Vitals:   09/03/22 1145  BP: (!) 114/55  Pulse: 60  Resp: 17  Temp: 98.4 F (36.9 C)  SpO2: 100%     General: Awake, no distress.  CV:  Good peripheral perfusion.  Resp:  Normal effort.  Abd:  No distention.  Low pelvic tenderness on the right.  No rebound.  No guarding. Other:     ED Results / Procedures / Treatments   Labs (all labs ordered are listed, but only abnormal results are displayed) Labs Reviewed  COMPREHENSIVE METABOLIC PANEL - Abnormal; Notable for the following components:      Result  Value   Glucose, Bld 107 (*)    All other components within normal limits  LIPASE, BLOOD  CBC  URINALYSIS, ROUTINE W REFLEX MICROSCOPIC  POC URINE PREG, ED    RADIOLOGY I have reviewed the ultrasound personally interpreted no large right cyst   PROCEDURES:  Critical Care performed: No  Procedures   MEDICATIONS ORDERED IN ED: Medications - No data to display   IMPRESSION / MDM / ASSESSMENT AND PLAN / ED COURSE  I reviewed the triage vital signs and the nursing notes.   Patient's presentation is most consistent with acute presentation with potential threat to life or bodily function.   Differential includes ovarian cyst ovarian torsion PID.  She denies any concerns for foreign bodies or new partners to suggest PID she is willing to self swab just to ensure.  Her pain does not really seem to be up high enough to be appendicitis seems to be more lower pelvic pain reports history of cyst.  Will start off with ultrasound  Preg test was negative.  CMP reassuring CBC reassuring.  Lipase normal.  GNC were negative.  Patient has clue cells positive.  hCG is negative  IMPRESSION:  1. Junctional zone hyperechoic foci in the uterus suggesting  adenomyosis.  2. Normal endometrial stripe thickness.  3. Circumscribed hyperechoic 1.6 cm diameter lesion in the left  ovary likely representing a small dermoid or hemorrhagic cyst.  Otherwise, no abnormal adnexal masses.  4. Small amount of free fluid is likely physiologic.    Patient is positive for bacterial vaginosis.  Her ultrasound is reassuring although with concern for some adenomyosis.  She does have a small cyst noted on the left ovary but her pain was more on the right ovary.  Her right ovary appears to be normal in size with normal blood flow to it.  I did discuss these results with patient she expressed understanding.  We discussed that we have not found an obvious cause for her lower pelvic pain but she is BV positive and would  like to start treatment for this.  We discussed the possibility of appendicitis which she reports having similar pelvic pain like this before so seems less likely.  We discussed that CT imaging would be the only way to further evaluate for her appendix.  This time she reports that she feels hungry and does not want to do CT.  She would rather go home.  She understands that she can return to the hospital tomorrow I did explain to her that I was working tomorrow can order her CT if she develop any fevers, nausea, vomiting, worsening abdominal pain but this time she would like to monitor her symptoms at home and return if they are worsening.     FINAL CLINICAL IMPRESSION(S) / ED DIAGNOSES   Final diagnoses:  Pelvic pain  BV (bacterial vaginosis)     Rx / DC Orders   ED Discharge Orders          Ordered    metroNIDAZOLE (FLAGYL) 500 MG tablet  2 times daily        09/03/22 1749             Note:  This document was prepared using Dragon voice recognition software and may include unintentional dictation errors.   Concha Se, MD 09/03/22 [phone]

## 2022-09-03 NOTE — ED Triage Notes (Signed)
Pt presents to the ED due to R side pain. Pt states the pain increases when walking and when on her cycle. Pt states the pain started 3 days ago. Pt c/o nausea and denies VD. pT a&oX4

## 2022-09-03 NOTE — ED Notes (Signed)
Pt to ultrasound now

## 2022-09-03 NOTE — ED Notes (Signed)
Pt back from u/s. Sitting in bed. No needs expressed at this time.

## 2023-05-14 ENCOUNTER — Emergency Department (HOSPITAL_COMMUNITY)
Admission: EM | Admit: 2023-05-14 | Discharge: 2023-05-14 | Disposition: A | Discharge status: Home / Self Care | Payer: 59 | Attending: Emergency Medicine | Admitting: Emergency Medicine

## 2023-05-14 ENCOUNTER — Emergency Department (HOSPITAL_COMMUNITY): Payer: 59

## 2023-05-14 DIAGNOSIS — R112 Nausea with vomiting, unspecified: Secondary | ICD-10-CM | POA: Diagnosis not present

## 2023-05-14 DIAGNOSIS — N83202 Unspecified ovarian cyst, left side: Secondary | ICD-10-CM | POA: Diagnosis not present

## 2023-05-14 DIAGNOSIS — R1032 Left lower quadrant pain: Secondary | ICD-10-CM | POA: Insufficient documentation

## 2023-05-14 LAB — CBC WITH DIFFERENTIAL/PLATELET
Abs Immature Granulocytes: 0.02 10*3/uL (ref 0.00–0.07)
Basophils Absolute: 0 10*3/uL (ref 0.0–0.1)
Basophils Relative: 1 %
Eosinophils Absolute: 0.2 10*3/uL (ref 0.0–0.5)
Eosinophils Relative: 3 %
HCT: 40.1 % (ref 36.0–46.0)
Hemoglobin: 13.6 g/dL (ref 12.0–15.0)
Immature Granulocytes: 0 %
Lymphocytes Relative: 32 %
Lymphs Abs: 2.1 10*3/uL (ref 0.7–4.0)
MCH: 33.4 pg (ref 26.0–34.0)
MCHC: 33.9 g/dL (ref 30.0–36.0)
MCV: 98.5 fL (ref 80.0–100.0)
Monocytes Absolute: 0.4 10*3/uL (ref 0.1–1.0)
Monocytes Relative: 7 %
Neutro Abs: 3.7 10*3/uL (ref 1.7–7.7)
Neutrophils Relative %: 57 %
Platelets: 189 10*3/uL (ref 150–400)
RBC: 4.07 MIL/uL (ref 3.87–5.11)
RDW: 12.3 % (ref 11.5–15.5)
WBC: 6.4 10*3/uL (ref 4.0–10.5)
nRBC: 0 % (ref 0.0–0.2)

## 2023-05-14 LAB — COMPREHENSIVE METABOLIC PANEL
ALT: 13 U/L (ref 0–44)
AST: 16 U/L (ref 15–41)
Albumin: 4.4 g/dL (ref 3.5–5.0)
Alkaline Phosphatase: 44 U/L (ref 38–126)
Anion gap: 8 (ref 5–15)
BUN: 8 mg/dL (ref 6–20)
CO2: 26 mmol/L (ref 22–32)
Calcium: 9.2 mg/dL (ref 8.9–10.3)
Chloride: 102 mmol/L (ref 98–111)
Creatinine, Ser: 0.83 mg/dL (ref 0.44–1.00)
GFR, Estimated: >60 mL/min (ref >60)
Glucose, Bld: 87 mg/dL (ref 70–99)
Potassium: 3.9 mmol/L (ref 3.5–5.1)
Sodium: 136 mmol/L (ref 135–145)
Total Bilirubin: 1.2 mg/dL (ref 0.0–1.2)
Total Protein: 6.6 g/dL (ref 6.5–8.1)

## 2023-05-14 LAB — LIPASE, BLOOD: Lipase: 21 U/L (ref 11–51)

## 2023-05-14 LAB — HCG, SERUM, QUALITATIVE: Preg, Serum: NEGATIVE

## 2023-05-14 LAB — RESP PANEL BY RT-PCR (RSV, FLU A&B, COVID)  RVPGX2
Influenza A by PCR: NEGATIVE
Influenza B by PCR: NEGATIVE
Resp Syncytial Virus by PCR: NEGATIVE
SARS Coronavirus 2 by RT PCR: NEGATIVE

## 2023-05-14 MED ORDER — KETOROLAC TROMETHAMINE 15 MG/ML IJ SOLN
15.0000 mg | Freq: Once | INTRAMUSCULAR | Status: AC
  Administered 2023-05-14: 15 mg via INTRAVENOUS
  Filled 2023-05-14: qty 1

## 2023-05-14 MED ORDER — LACTATED RINGERS IV BOLUS
1000.0000 mL | Freq: Once | INTRAVENOUS | Status: AC
  Administered 2023-05-14: 1000 mL via INTRAVENOUS

## 2023-05-14 MED ORDER — IOHEXOL 300 MG/ML  SOLN
100.0000 mL | Freq: Once | INTRAMUSCULAR | Status: AC | PRN
  Administered 2023-05-14: 100 mL via INTRAVENOUS

## 2023-05-14 MED ORDER — ONDANSETRON HCL 4 MG/2ML IJ SOLN
4.0000 mg | Freq: Once | INTRAMUSCULAR | Status: AC
  Administered 2023-05-14: 4 mg via INTRAVENOUS
  Filled 2023-05-14: qty 2

## 2023-05-14 MED ORDER — IBUPROFEN 600 MG PO TABS: 600.0000 mg | ORAL_TABLET | Freq: Four times a day (QID) | ORAL | 0 refills | Status: AC | PRN

## 2023-05-14 MED ORDER — ONDANSETRON HCL 4 MG PO TABS
4.0000 mg | ORAL_TABLET | Freq: Three times a day (TID) | ORAL | 0 refills | Status: AC | PRN
Start: 2023-05-14 — End: ?

## 2023-05-14 NOTE — ED Provider Notes (Signed)
[place] EM[facility]GENCY DEPARTMENT AT [facility] Provider Note   CSN: [id number] Arrival date & time: 05/14/23  1227     History Chief Complaint  Patient presents with   Abdominal Pain    Kimberly Bradley is a 33 y.o. female self reportedly otherwise healthy presents to the ER today for evaluation of LLQ pain with nausea and vomiting for the past 4 days. She reports that someone at her job had the flu and she was concerned this was causing her symptoms. She reports that she has had an occasional cough, but no fevers, nasal congestion, rhinorrhea, sore throat or other FLS. She denies any dysuria or hematuria.  Reports that she may be having some increase in urgency/frequency.  She reports that she is having almost normal vomiting this morning.  Not black or bloody.  She is not having any abnormal discharge or vaginal bleeding.  She reports that she tried some Tamiflu for her symptoms because it was only initiated help without much relief.  She is still able to pass gas.  She is allergic to tramadol and codeine.  Daily tobacco user.  Denies any alcohol.  Daily marijuana user.  Previous abdominal surgeries include bilateral tubal ligation and hernia repair.   Abdominal Pain Associated symptoms: nausea and vomiting   Associated symptoms: no chest pain, no chills, no constipation, no diarrhea, no dysuria, no fever, no hematuria, no shortness of breath, no sore throat, no vaginal bleeding and no vaginal discharge        Home Medications Prior to Admission medications   Not on File      Allergies    Tramadol hcl and Codeine    Review of Systems   Review of Systems  Constitutional:  Negative for chills and fever.  HENT:  Negative for congestion, ear pain, rhinorrhea and sore throat.   Respiratory:  Negative for shortness of breath.   Cardiovascular:  Negative for chest pain.  Gastrointestinal:  Positive for abdominal pain, nausea and vomiting. Negative for blood in stool,  constipation and diarrhea.  Genitourinary:  Positive for frequency and urgency. Negative for dysuria, flank pain, hematuria, pelvic pain, vaginal bleeding, vaginal discharge and vaginal pain.    Physical Exam Updated Vital Signs BP 105/70 (BP Location: Left Arm)   Pulse 68   Temp 98 F (36.7 C) (Oral)   Resp 16   Ht 5\' 7"  (1.702 m)   Wt 51.3 kg   LMP 04/18/2023 (Exact Date)   SpO2 100%   BMI 17.70 kg/m  Physical Exam Vitals and nursing note reviewed.  Constitutional:      General: She is not in acute distress.    Appearance: She is not toxic-appearing.  HENT:     Mouth/Throat:     Comments: Dry mucous membranes. Eyes:     General: No scleral icterus. Cardiovascular:     Rate and Rhythm: Normal rate.  Pulmonary:     Effort: Pulmonary effort is normal. No respiratory distress.  Abdominal:     General: Bowel sounds are normal. There is no distension.     Palpations: Abdomen is soft.     Tenderness: There is abdominal tenderness in the left upper quadrant and left lower quadrant. There is no guarding or rebound.     Comments: Mild left upper and lower quadrant tenderness palpation.  No guarding or rebound.  Soft.  Nondistended.  Skin:    General: Skin is warm and dry.  Neurological:     Mental Status: She  is alert.     ED Results / Procedures / Treatments   Labs (all labs ordered are listed, but only abnormal results are displayed) Labs Reviewed  RESP PANEL BY RT-PCR (RSV, FLU A&B, COVID)  [id number]  CBC WITH DIFFERENTIAL/PLATELET  COMPREHENSIVE METABOLIC PANEL  LIPASE, BLOOD  HCG, SERUM, QUALITATIVE  URINALYSIS, ROUTINE W REFLEX MICROSCOPIC    EKG None  Radiology No results found.  Procedures Procedures   Medications Ordered in ED Medications  lactated ringers bolus 1,000 mL (has no administration in time range)  ondansetron (ZOFRAN) injection 4 mg (has no administration in time range)  ketorolac (TORADOL) 15 MG/ML injection 15 mg (has no administration  in time range)    ED Course/ Medical Decision Making/ A&P   Medical Decision Making Amount and/or Complexity of Data Reviewed Labs: ordered. Radiology: ordered.  Risk Prescription drug management.   33 y.o. female presents to the ER for evaluation of lower abdominal pain with N/V. Differential diagnosis includes but is not limited to AAA, mesenteric ischemia, appendicitis, diverticulitis, DKA, gastroenteritis, nephrolithiasis, pancreatitis, constipation, UTI, bowel obstruction, biliary disease, IBD, PUD, hepatitis, ectopic pregnancy, ovarian torsion, PID. Vital signs unremarkable. Physical exam as noted above.   I independently reviewed and interpreted the patient's labs.  CMP unremarkable for any electrolyte or LFT abnormality.  hCG is negative.  Lipase within normal limits.  X-ray panel here for COVID, flu, RSV negative. CBC without leukocytosis or anemia. Urinalysis still needs to be collected. Patient is being given IV fluids now in hopes for rehydration to obtain a sample.   CT imaging is ordered.   3:08 PM Care of Kimberly Bradley transferred to PA Fayrene Helper at the end of my shift as the patient will require reassessment once labs/imaging have resulted. Patient presentation, ED course, and plan of care discussed with review of all pertinent labs and imaging. Please see his/her note for further details regarding further ED course and disposition. Plan at time of handoff is follow up on urinalysis and CT imaging. Disposition to be determined on results. This may be altered or completely changed at the discretion of the oncoming team pending results of further workup.  Portions of this report may have been transcribed using voice recognition software. Every effort was made to ensure accuracy; however, inadvertent computerized transcription errors may be present.   Final Clinical Impression(s) / ED Diagnoses Final diagnoses:  None    Rx / DC Orders ED Discharge Orders     None          Achille Rich, PA-C 05/14/23 1508    Gloris Manchester, MD 05/15/23 1558

## 2023-05-14 NOTE — ED Provider Notes (Signed)
Received signout from previous provider, please see her note for complete H&P.  For the past 4 days patient has has pain to her left lower abdomen.  She also endorsed occasional nausea and vomiting.  No diarrhea constipation no fever chills no urinary discomfort no vaginal bleeding or vaginal discharge.  Labs obtained independent viewed and treatment by me and overall reassuring.  CT scan of abdomen pelvis was obtained independent viewed interpreted by me which shows no acute laboratory process.  Agree with radiology interpretation.  However, there are well-circumscribed hypoattenuating structure in the bilateral adnexa with the largest measuring up to 2.2 x 2.6 on the on the left side which favored ovarian in etiology.  I did discuss this with patient.  Will offer performing a pelvic exam as well as potential pelvic ultrasound to rule out ovarian torsion, TOA, or PID.  Patient admits that she does have history of ovarian cyst.  Patient declined further testing and agrees to follow-up closely with OB/GYN for further care.  Given that her symptoms ongoing for the past 4 days my suspicion for TOA, PID, ovarian torsion is low.  Patient certainly can return with worsening symptoms.  She understands we may have missed additional testing intervention without performing pelvic exam and possible pelvic ultrasound.  Patient understand her risk.  Exam overall reassuring, no significant tenderness on palpation of abdomen.  BP 105/70 (BP Location: Left Arm)   Pulse 68   Temp 98 F (36.7 C) (Oral)   Resp 16   Ht 5\' 7"  (1.702 m)   Wt 51.3 kg   LMP 04/18/2023 (Exact Date)   SpO2 100%   BMI 17.70 kg/m   Results for orders placed or performed during the hospital encounter of 05/14/23  Resp panel by RT-PCR (RSV, Flu A&B, Covid) Anterior Nasal Swab   Collection Time: 05/14/23  1:39 PM   Specimen: Anterior Nasal Swab  Result Value Ref Range   SARS Coronavirus 2 by RT PCR NEGATIVE NEGATIVE   Influenza A by PCR  NEGATIVE NEGATIVE   Influenza B by PCR NEGATIVE NEGATIVE   Resp Syncytial Virus by PCR NEGATIVE NEGATIVE  CBC with Differential   Collection Time: 05/14/23  1:39 PM  Result Value Ref Range   WBC 6.4 4.0 - 10.5 K/uL   RBC 4.07 3.87 - 5.11 MIL/uL   Hemoglobin 13.6 12.0 - 15.0 g/dL   HCT 32.4 40.1 - 02.7 %   MCV 98.5 80.0 - 100.0 fL   MCH 33.4 26.0 - 34.0 pg   MCHC 33.9 30.0 - 36.0 g/dL   RDW 25.3 66.4 - 40.3 %   Platelets 189 150 - 400 K/uL   nRBC 0.0 0.0 - 0.2 %   Neutrophils Relative % 57 %   Neutro Abs 3.7 1.7 - 7.7 K/uL   Lymphocytes Relative 32 %   Lymphs Abs 2.1 0.7 - 4.0 K/uL   Monocytes Relative 7 %   Monocytes Absolute 0.4 0.1 - 1.0 K/uL   Eosinophils Relative 3 %   Eosinophils Absolute 0.2 0.0 - 0.5 K/uL   Basophils Relative 1 %   Basophils Absolute 0.0 0.0 - 0.1 K/uL   Immature Granulocytes 0 %   Abs Immature Granulocytes 0.02 0.00 - 0.07 K/uL  Comprehensive metabolic panel   Collection Time: 05/14/23  1:39 PM  Result Value Ref Range   Sodium 136 135 - 145 mmol/L   Potassium 3.9 3.5 - 5.1 mmol/L   Chloride 102 98 - 111 mmol/L   CO2 26 22 -  32 mmol/L   Glucose, Bld 87 70 - 99 mg/dL   BUN 8 6 - 20 mg/dL   Creatinine, Ser 1.61 0.44 - 1.00 mg/dL   Calcium 9.2 8.9 - 09.6 mg/dL   Total Protein 6.6 6.5 - 8.1 g/dL   Albumin 4.4 3.5 - 5.0 g/dL   AST 16 15 - 41 U/L   ALT 13 0 - 44 U/L   Alkaline Phosphatase 44 38 - 126 U/L   Total Bilirubin 1.2 0.0 - 1.2 mg/dL   GFR, Estimated >04 >54 mL/min   Anion gap 8 5 - 15  Lipase, blood   Collection Time: 05/14/23  1:39 PM  Result Value Ref Range   Lipase 21 11 - 51 U/L  hCG, serum, qualitative   Collection Time: 05/14/23  1:39 PM  Result Value Ref Range   Preg, Serum NEGATIVE NEGATIVE   CT ABDOMEN PELVIS W CONTRAST Result Date: 05/14/2023 CLINICAL DATA:  Left lower quadrant abdominal pain. EXAM: CT ABDOMEN AND PELVIS WITH CONTRAST TECHNIQUE: Multidetector CT imaging of the abdomen and pelvis was performed using the  standard protocol following bolus administration of intravenous contrast. RADIATION DOSE REDUCTION: This exam was performed according to the departmental dose-optimization program which includes automated exposure control, adjustment of the mA and/or kV according to patient size and/or use of iterative reconstruction technique. CONTRAST:  OMNIPAQUE IOHEXOL 300 MG/ML  SOLN COMPARISON:  CT scan abdomen and pelvis from 12/14/2019. FINDINGS: Lower chest: There are subpleural atelectatic changes in the visualized lung bases. No overt consolidation. No pleural effusion. The heart is normal in size. No pericardial effusion. Hepatobiliary: The liver is normal in size. Non-cirrhotic configuration. No suspicious mass. There is slightly heterogeneous attenuation of the right hepatic dome, which is nonspecific but was present on the prior exam from 2021 and favored benign in etiology such as focal fatty infiltration, arterial portal shunting, etc. No intrahepatic or extrahepatic bile duct dilation. No calcified gallstones. Normal gallbladder wall thickness. No pericholecystic inflammatory changes. Pancreas: Unremarkable. No pancreatic ductal dilatation or surrounding inflammatory changes. Spleen: Within normal limits. No focal lesion. Adrenals/Urinary Tract: Adrenal glands are unremarkable. No suspicious renal mass. No hydronephrosis. Note is made of right extrarenal pelvis. No renal or ureteric calculi. Unremarkable urinary bladder. Stomach/Bowel: No disproportionate dilation of the small or large bowel loops. No evidence of abnormal bowel wall thickening or inflammatory changes. The appendix is unremarkable. Vascular/Lymphatic: No ascites or pneumoperitoneum. No abdominal or pelvic lymphadenopathy, by size criteria. No aneurysmal dilation of the major abdominal arteries. Reproductive: Reproductive organs are not well evaluated on the CT scan exam. However, having said that normal-sized retroverted uterus is seen without  focal mass. There are well-circumscribed hypoattenuating structures in bilateral adnexa with largest measuring up to 2.2 x 2.6 cm in the left adnexa. These are favored ovarian in etiology, such as ovarian follicular cyst versus dominant follicles. Correlate clinically to determine the need for further characterization with pelvic ultrasound. There are two presumed fallopian tube closure device. Other: The visualized soft tissues and abdominal wall are unremarkable. Musculoskeletal: No suspicious osseous lesions. IMPRESSION: 1. No acute inflammatory process identified within the abdomen or pelvis. 2. There are well-circumscribed hypoattenuating structures in bilateral adnexa with largest measuring up to 2.2 x 2.6 cm on the left side. These are favored ovarian in etiology. Correlate clinically to determine the need for additional imaging with pelvic ultrasound. 3. Multiple other nonacute observations, as described above. Electronically Signed   By: Jules Schick M.D.   On: 05/14/2023  16:21      Fayrene Helper, PA-C 05/14/23 1650    Lonell Grandchild, MD 05/14/23 1950

## 2023-05-14 NOTE — Discharge Instructions (Signed)
Your pain may be due to ovarian cyst.  Please follow-up closely with your OB/GYN for outpatient evaluation.  You may take ibuprofen as needed for pain and take Zofran as needed for nausea.  Please be aware we did not perform a pelvic exam or a pelvic ultrasound therefore, we were unable to rule out ovarian torsion, pelvic inflammatory disease, or tubo-ovarian abscess causing acute discomfort.  If your condition worsens please return

## 2023-05-14 NOTE — ED Triage Notes (Signed)
Patient reports lower abd pain x 4 days Dull/stabbing Started on left side and now radiates across stomach Pain rated 5/10 Endorses n/v .. No diarrhea Denies injury  Has had sick contacts

## 2023-05-16 ENCOUNTER — Telehealth: Payer: Self-pay

## 2023-05-16 NOTE — Telephone Encounter (Signed)
Tried contacting the patient but I was unable to reach her. I was unable to leave a voice mail due to HIPAA.
